# Patient Record
Sex: Female | Born: 1950 | Race: White | Hispanic: No | Smoking: Never smoker
Health system: Southern US, Community
[De-identification: ages and names within clinical notes are randomized; demographics above are authoritative.]

## PROBLEM LIST (undated history)

## (undated) DIAGNOSIS — I10 Essential (primary) hypertension: Secondary | ICD-10-CM

## (undated) DIAGNOSIS — R58 Hemorrhage, not elsewhere classified: Secondary | ICD-10-CM

## (undated) DIAGNOSIS — K219 Gastro-esophageal reflux disease without esophagitis: Secondary | ICD-10-CM

## (undated) DIAGNOSIS — K222 Esophageal obstruction: Secondary | ICD-10-CM

## (undated) DIAGNOSIS — E785 Hyperlipidemia, unspecified: Secondary | ICD-10-CM

## (undated) DIAGNOSIS — E669 Obesity, unspecified: Secondary | ICD-10-CM

## (undated) HISTORY — DX: Gastro-esophageal reflux disease without esophagitis: K21.9

## (undated) HISTORY — DX: Hyperlipidemia, unspecified: E78.5

## (undated) HISTORY — PX: KNEE SURGERY: SHX244

## (undated) HISTORY — DX: Obesity, unspecified: E66.9

## (undated) HISTORY — DX: Hemorrhage, not elsewhere classified: R58

## (undated) HISTORY — DX: Morbid (severe) obesity due to excess calories: E66.01

## (undated) HISTORY — DX: Essential (primary) hypertension: I10

## (undated) HISTORY — DX: Esophageal obstruction: K22.2

## (undated) HISTORY — PX: PARTIAL HYSTERECTOMY: SHX80

---

## 1991-02-10 DIAGNOSIS — R58 Hemorrhage, not elsewhere classified: Secondary | ICD-10-CM

## 1991-02-10 HISTORY — DX: Hemorrhage, not elsewhere classified: R58

## 2000-02-10 HISTORY — PX: NECK SURGERY: SHX720

## 2000-06-25 ENCOUNTER — Ambulatory Visit (HOSPITAL_COMMUNITY): Admission: RE | Admit: 2000-06-25 | Discharge: 2000-06-25 | Payer: Self-pay | Admitting: Pulmonary Disease

## 2000-10-12 ENCOUNTER — Ambulatory Visit (HOSPITAL_COMMUNITY): Admission: RE | Admit: 2000-10-12 | Discharge: 2000-10-12 | Payer: Self-pay | Admitting: Pulmonary Disease

## 2000-11-01 ENCOUNTER — Observation Stay (HOSPITAL_COMMUNITY): Admission: RE | Admit: 2000-11-01 | Discharge: 2000-11-02 | Payer: Self-pay | Admitting: Neurosurgery

## 2000-12-02 ENCOUNTER — Encounter: Admission: RE | Admit: 2000-12-02 | Discharge: 2000-12-02 | Payer: Self-pay | Admitting: Neurosurgery

## 2001-01-14 ENCOUNTER — Encounter: Payer: Self-pay | Admitting: Emergency Medicine

## 2001-01-14 ENCOUNTER — Emergency Department (HOSPITAL_COMMUNITY): Admission: EM | Admit: 2001-01-14 | Discharge: 2001-01-14 | Payer: Self-pay | Admitting: Emergency Medicine

## 2001-10-22 ENCOUNTER — Emergency Department (HOSPITAL_COMMUNITY): Admission: EM | Admit: 2001-10-22 | Discharge: 2001-10-23 | Payer: Self-pay | Admitting: *Deleted

## 2001-10-23 ENCOUNTER — Encounter: Payer: Self-pay | Admitting: *Deleted

## 2001-11-10 ENCOUNTER — Emergency Department (HOSPITAL_COMMUNITY): Admission: EM | Admit: 2001-11-10 | Discharge: 2001-11-10 | Payer: Self-pay | Admitting: Emergency Medicine

## 2001-11-13 ENCOUNTER — Emergency Department (HOSPITAL_COMMUNITY): Admission: EM | Admit: 2001-11-13 | Discharge: 2001-11-13 | Payer: Self-pay | Admitting: Emergency Medicine

## 2001-11-17 ENCOUNTER — Emergency Department (HOSPITAL_COMMUNITY): Admission: EM | Admit: 2001-11-17 | Discharge: 2001-11-17 | Payer: Self-pay | Admitting: Emergency Medicine

## 2001-11-24 ENCOUNTER — Emergency Department (HOSPITAL_COMMUNITY): Admission: EM | Admit: 2001-11-24 | Discharge: 2001-11-24 | Payer: Self-pay | Admitting: Emergency Medicine

## 2001-12-08 ENCOUNTER — Emergency Department (HOSPITAL_COMMUNITY): Admission: EM | Admit: 2001-12-08 | Discharge: 2001-12-08 | Payer: Self-pay | Admitting: Emergency Medicine

## 2002-01-04 ENCOUNTER — Ambulatory Visit (HOSPITAL_COMMUNITY): Admission: RE | Admit: 2002-01-04 | Discharge: 2002-01-04 | Payer: Self-pay | Admitting: Obstetrics and Gynecology

## 2002-01-04 ENCOUNTER — Encounter: Payer: Self-pay | Admitting: Obstetrics and Gynecology

## 2002-01-10 ENCOUNTER — Ambulatory Visit (HOSPITAL_COMMUNITY): Admission: RE | Admit: 2002-01-10 | Discharge: 2002-01-10 | Payer: Self-pay | Admitting: Obstetrics and Gynecology

## 2002-01-10 ENCOUNTER — Encounter: Payer: Self-pay | Admitting: Obstetrics and Gynecology

## 2002-05-07 ENCOUNTER — Emergency Department (HOSPITAL_COMMUNITY): Admission: EM | Admit: 2002-05-07 | Discharge: 2002-05-07 | Payer: Self-pay | Admitting: Internal Medicine

## 2002-05-07 ENCOUNTER — Encounter: Payer: Self-pay | Admitting: Internal Medicine

## 2002-05-10 ENCOUNTER — Ambulatory Visit (HOSPITAL_COMMUNITY): Admission: RE | Admit: 2002-05-10 | Discharge: 2002-05-10 | Payer: Self-pay | Admitting: Pulmonary Disease

## 2003-04-24 ENCOUNTER — Ambulatory Visit (HOSPITAL_COMMUNITY): Admission: RE | Admit: 2003-04-24 | Discharge: 2003-04-24 | Payer: Self-pay | Admitting: Pulmonary Disease

## 2003-05-23 ENCOUNTER — Ambulatory Visit (HOSPITAL_COMMUNITY): Admission: RE | Admit: 2003-05-23 | Discharge: 2003-05-23 | Payer: Self-pay | Admitting: Pulmonary Disease

## 2003-09-28 ENCOUNTER — Ambulatory Visit (HOSPITAL_COMMUNITY): Admission: RE | Admit: 2003-09-28 | Discharge: 2003-09-28 | Payer: Self-pay | Admitting: Pulmonary Disease

## 2003-12-21 ENCOUNTER — Emergency Department (HOSPITAL_COMMUNITY): Admission: EM | Admit: 2003-12-21 | Discharge: 2003-12-21 | Payer: Self-pay | Admitting: Emergency Medicine

## 2004-03-27 ENCOUNTER — Ambulatory Visit: Payer: Self-pay | Admitting: Internal Medicine

## 2004-03-28 ENCOUNTER — Ambulatory Visit (HOSPITAL_COMMUNITY): Admission: RE | Admit: 2004-03-28 | Discharge: 2004-03-28 | Payer: Self-pay | Admitting: Pulmonary Disease

## 2004-04-15 ENCOUNTER — Ambulatory Visit (HOSPITAL_COMMUNITY): Admission: RE | Admit: 2004-04-15 | Discharge: 2004-04-15 | Payer: Self-pay | Admitting: Internal Medicine

## 2004-04-15 ENCOUNTER — Ambulatory Visit: Payer: Self-pay | Admitting: Internal Medicine

## 2004-04-18 ENCOUNTER — Ambulatory Visit (HOSPITAL_COMMUNITY): Admission: RE | Admit: 2004-04-18 | Discharge: 2004-04-18 | Payer: Self-pay | Admitting: Cardiology

## 2004-05-01 ENCOUNTER — Inpatient Hospital Stay (HOSPITAL_BASED_OUTPATIENT_CLINIC_OR_DEPARTMENT_OTHER): Admission: RE | Admit: 2004-05-01 | Discharge: 2004-05-01 | Payer: Self-pay | Admitting: Cardiology

## 2004-07-15 ENCOUNTER — Ambulatory Visit (HOSPITAL_COMMUNITY): Admission: RE | Admit: 2004-07-15 | Discharge: 2004-07-15 | Payer: Self-pay | Admitting: Internal Medicine

## 2004-07-18 ENCOUNTER — Ambulatory Visit (HOSPITAL_COMMUNITY): Admission: RE | Admit: 2004-07-18 | Discharge: 2004-07-18 | Payer: Self-pay | Admitting: Internal Medicine

## 2004-07-23 ENCOUNTER — Ambulatory Visit (HOSPITAL_COMMUNITY): Admission: RE | Admit: 2004-07-23 | Discharge: 2004-07-23 | Payer: Self-pay | Admitting: Pulmonary Disease

## 2004-08-11 ENCOUNTER — Ambulatory Visit: Payer: Self-pay | Admitting: Internal Medicine

## 2004-09-26 ENCOUNTER — Ambulatory Visit (HOSPITAL_COMMUNITY): Admission: RE | Admit: 2004-09-26 | Discharge: 2004-09-26 | Payer: Self-pay | Admitting: Cardiology

## 2006-05-14 ENCOUNTER — Ambulatory Visit (HOSPITAL_COMMUNITY): Admission: RE | Admit: 2006-05-14 | Discharge: 2006-05-14 | Payer: Self-pay | Admitting: Pulmonary Disease

## 2006-05-20 ENCOUNTER — Ambulatory Visit (HOSPITAL_COMMUNITY): Admission: RE | Admit: 2006-05-20 | Discharge: 2006-05-20 | Payer: Self-pay | Admitting: Obstetrics and Gynecology

## 2006-08-06 ENCOUNTER — Ambulatory Visit (HOSPITAL_COMMUNITY): Admission: RE | Admit: 2006-08-06 | Discharge: 2006-08-06 | Payer: Self-pay | Admitting: Cardiology

## 2007-06-29 ENCOUNTER — Ambulatory Visit: Payer: Self-pay | Admitting: Internal Medicine

## 2007-07-11 ENCOUNTER — Ambulatory Visit: Payer: Self-pay | Admitting: Internal Medicine

## 2007-07-11 ENCOUNTER — Ambulatory Visit (HOSPITAL_COMMUNITY): Admission: RE | Admit: 2007-07-11 | Discharge: 2007-07-11 | Payer: Self-pay | Admitting: Internal Medicine

## 2007-07-11 HISTORY — PX: ESOPHAGOGASTRODUODENOSCOPY: SHX1529

## 2007-07-11 HISTORY — PX: COLONOSCOPY: SHX174

## 2007-08-26 ENCOUNTER — Ambulatory Visit: Payer: Self-pay | Admitting: Internal Medicine

## 2007-11-08 ENCOUNTER — Ambulatory Visit: Payer: Self-pay | Admitting: Family Medicine

## 2007-11-08 DIAGNOSIS — IMO0002 Reserved for concepts with insufficient information to code with codable children: Secondary | ICD-10-CM

## 2007-11-08 DIAGNOSIS — K649 Unspecified hemorrhoids: Secondary | ICD-10-CM | POA: Insufficient documentation

## 2007-11-08 DIAGNOSIS — E785 Hyperlipidemia, unspecified: Secondary | ICD-10-CM

## 2007-11-08 DIAGNOSIS — K208 Other esophagitis: Secondary | ICD-10-CM

## 2007-11-08 DIAGNOSIS — I10 Essential (primary) hypertension: Secondary | ICD-10-CM | POA: Insufficient documentation

## 2007-11-08 DIAGNOSIS — Z8679 Personal history of other diseases of the circulatory system: Secondary | ICD-10-CM | POA: Insufficient documentation

## 2007-11-11 ENCOUNTER — Ambulatory Visit (HOSPITAL_COMMUNITY): Admission: RE | Admit: 2007-11-11 | Discharge: 2007-11-11 | Payer: Self-pay | Admitting: Family Medicine

## 2007-11-11 ENCOUNTER — Telehealth (INDEPENDENT_AMBULATORY_CARE_PROVIDER_SITE_OTHER): Payer: Self-pay | Admitting: Family Medicine

## 2007-11-11 ENCOUNTER — Encounter (INDEPENDENT_AMBULATORY_CARE_PROVIDER_SITE_OTHER): Payer: Self-pay | Admitting: Family Medicine

## 2007-11-23 ENCOUNTER — Ambulatory Visit: Payer: Self-pay | Admitting: Family Medicine

## 2007-11-23 LAB — CONVERTED CEMR LAB
Bilirubin Urine: NEGATIVE
Blood in Urine, dipstick: NEGATIVE
Cholesterol, target level: 200 mg/dL
Glucose, Urine, Semiquant: NEGATIVE
HDL goal, serum: 40 mg/dL
Ketones, urine, test strip: NEGATIVE
LDL Goal: 130 mg/dL
Nitrite: NEGATIVE
Protein, U semiquant: NEGATIVE
Specific Gravity, Urine: 1.015
Urobilinogen, UA: 0.2
WBC Urine, dipstick: NEGATIVE
pH: 6

## 2007-12-08 ENCOUNTER — Encounter (INDEPENDENT_AMBULATORY_CARE_PROVIDER_SITE_OTHER): Payer: Self-pay | Admitting: Family Medicine

## 2007-12-27 ENCOUNTER — Encounter (INDEPENDENT_AMBULATORY_CARE_PROVIDER_SITE_OTHER): Payer: Self-pay | Admitting: Family Medicine

## 2007-12-28 LAB — CONVERTED CEMR LAB
ALT: 18 units/L (ref 0–35)
AST: 25 units/L (ref 0–37)
Albumin: 4.5 g/dL (ref 3.5–5.2)
Alkaline Phosphatase: 50 units/L (ref 39–117)
BUN: 40 mg/dL — ABNORMAL HIGH (ref 6–23)
Basophils Absolute: 0.1 10*3/uL (ref 0.0–0.1)
Basophils Relative: 1 % (ref 0–1)
CO2: 26 meq/L (ref 19–32)
Calcium: 9.8 mg/dL (ref 8.4–10.5)
Chloride: 102 meq/L (ref 96–112)
Cholesterol: 165 mg/dL (ref 0–200)
Creatinine, Ser: 1.01 mg/dL (ref 0.40–1.20)
Eosinophils Absolute: 0.1 10*3/uL (ref 0.0–0.7)
Eosinophils Relative: 2 % (ref 0–5)
Glucose, Bld: 87 mg/dL (ref 70–99)
HCT: 37.1 % (ref 36.0–46.0)
HDL: 68 mg/dL (ref >39)
Hemoglobin: 11.9 g/dL — ABNORMAL LOW (ref 12.0–15.0)
LDL Cholesterol: 79 mg/dL (ref 0–99)
Lymphocytes Relative: 29 % (ref 12–46)
Lymphs Abs: 2.3 10*3/uL (ref 0.7–4.0)
MCHC: 32.1 g/dL (ref 30.0–36.0)
MCV: 91.4 fL (ref 78.0–100.0)
Monocytes Absolute: 0.6 10*3/uL (ref 0.1–1.0)
Monocytes Relative: 7 % (ref 3–12)
Neutro Abs: 4.9 10*3/uL (ref 1.7–7.7)
Neutrophils Relative %: 61 % (ref 43–77)
Platelets: 218 10*3/uL (ref 150–400)
Potassium: 4.3 meq/L (ref 3.5–5.3)
RBC: 4.06 M/uL (ref 3.87–5.11)
RDW: 13.8 % (ref 11.5–15.5)
Sodium: 140 meq/L (ref 135–145)
TSH: 2.815 microintl units/mL (ref 0.350–4.50)
Total Bilirubin: 1.7 mg/dL — ABNORMAL HIGH (ref 0.3–1.2)
Total CHOL/HDL Ratio: 2.4
Total Protein: 6.9 g/dL (ref 6.0–8.3)
Triglycerides: 90 mg/dL (ref <150)
VLDL: 18 mg/dL (ref 0–40)
WBC: 8 10*3/uL (ref 4.0–10.5)

## 2008-01-04 ENCOUNTER — Ambulatory Visit: Payer: Self-pay | Admitting: Family Medicine

## 2008-01-04 DIAGNOSIS — E669 Obesity, unspecified: Secondary | ICD-10-CM | POA: Insufficient documentation

## 2008-01-04 DIAGNOSIS — R17 Unspecified jaundice: Secondary | ICD-10-CM | POA: Insufficient documentation

## 2008-01-04 DIAGNOSIS — J301 Allergic rhinitis due to pollen: Secondary | ICD-10-CM

## 2008-01-04 DIAGNOSIS — D649 Anemia, unspecified: Secondary | ICD-10-CM | POA: Insufficient documentation

## 2008-01-05 ENCOUNTER — Encounter (INDEPENDENT_AMBULATORY_CARE_PROVIDER_SITE_OTHER): Payer: Self-pay | Admitting: Family Medicine

## 2008-01-09 LAB — CONVERTED CEMR LAB
Iron: 92 ug/dL (ref 42–145)
Retic Ct Pct: 1.2 % (ref 0.4–3.1)
Saturation Ratios: 27 % (ref 20–55)
TIBC: 347 ug/dL (ref 250–470)
UIBC: 255 ug/dL

## 2008-03-13 ENCOUNTER — Telehealth (INDEPENDENT_AMBULATORY_CARE_PROVIDER_SITE_OTHER): Payer: Self-pay | Admitting: *Deleted

## 2008-03-14 ENCOUNTER — Ambulatory Visit: Payer: Self-pay | Admitting: Internal Medicine

## 2008-03-14 ENCOUNTER — Encounter (INDEPENDENT_AMBULATORY_CARE_PROVIDER_SITE_OTHER): Payer: Self-pay | Admitting: Family Medicine

## 2008-04-04 ENCOUNTER — Ambulatory Visit: Payer: Self-pay | Admitting: Family Medicine

## 2008-05-02 ENCOUNTER — Ambulatory Visit: Payer: Self-pay | Admitting: Family Medicine

## 2008-05-02 DIAGNOSIS — E559 Vitamin D deficiency, unspecified: Secondary | ICD-10-CM | POA: Insufficient documentation

## 2008-05-03 ENCOUNTER — Encounter (INDEPENDENT_AMBULATORY_CARE_PROVIDER_SITE_OTHER): Payer: Self-pay | Admitting: Family Medicine

## 2008-05-03 LAB — CONVERTED CEMR LAB
ALT: 17 units/L (ref 0–35)
AST: 24 units/L (ref 0–37)
Albumin: 4.5 g/dL (ref 3.5–5.2)
Alkaline Phosphatase: 42 units/L (ref 39–117)
BUN: 29 mg/dL — ABNORMAL HIGH (ref 6–23)
Basophils Absolute: 0 10*3/uL (ref 0.0–0.1)
Basophils Relative: 1 % (ref 0–1)
CO2: 23 meq/L (ref 19–32)
Calcium: 10.1 mg/dL (ref 8.4–10.5)
Chloride: 103 meq/L (ref 96–112)
Creatinine, Ser: 1.08 mg/dL (ref 0.40–1.20)
Eosinophils Absolute: 0.1 10*3/uL (ref 0.0–0.7)
Eosinophils Relative: 2 % (ref 0–5)
Glucose, Bld: 91 mg/dL (ref 70–99)
HCT: 39 % (ref 36.0–46.0)
Hemoglobin: 13.2 g/dL (ref 12.0–15.0)
Lymphocytes Relative: 31 % (ref 12–46)
Lymphs Abs: 1.9 10*3/uL (ref 0.7–4.0)
MCHC: 33.8 g/dL (ref 30.0–36.0)
MCV: 88.6 fL (ref 78.0–100.0)
Monocytes Absolute: 0.5 10*3/uL (ref 0.1–1.0)
Monocytes Relative: 8 % (ref 3–12)
Neutro Abs: 3.7 10*3/uL (ref 1.7–7.7)
Neutrophils Relative %: 59 % (ref 43–77)
Platelets: 213 10*3/uL (ref 150–400)
Potassium: 4.1 meq/L (ref 3.5–5.3)
RBC: 4.4 M/uL (ref 3.87–5.11)
RDW: 13.3 % (ref 11.5–15.5)
Sodium: 139 meq/L (ref 135–145)
Total Bilirubin: 1.9 mg/dL — ABNORMAL HIGH (ref 0.3–1.2)
Total Protein: 7.1 g/dL (ref 6.0–8.3)
WBC: 6.2 10*3/uL (ref 4.0–10.5)

## 2008-05-08 LAB — CONVERTED CEMR LAB: Vit D, 25-Hydroxy: 77 ng/mL (ref 30–89)

## 2008-08-01 ENCOUNTER — Ambulatory Visit: Payer: Self-pay | Admitting: Family Medicine

## 2008-08-01 DIAGNOSIS — R0609 Other forms of dyspnea: Secondary | ICD-10-CM

## 2008-08-01 DIAGNOSIS — R0989 Other specified symptoms and signs involving the circulatory and respiratory systems: Secondary | ICD-10-CM

## 2008-08-02 ENCOUNTER — Encounter (INDEPENDENT_AMBULATORY_CARE_PROVIDER_SITE_OTHER): Payer: Self-pay | Admitting: Family Medicine

## 2008-08-02 LAB — CONVERTED CEMR LAB
ALT: 13 units/L (ref 0–35)
AST: 24 units/L (ref 0–37)
Albumin: 4.3 g/dL (ref 3.5–5.2)
Alkaline Phosphatase: 40 units/L (ref 39–117)
Bilirubin, Direct: 0.4 mg/dL — ABNORMAL HIGH (ref 0.0–0.3)
Indirect Bilirubin: 1.4 mg/dL — ABNORMAL HIGH (ref 0.0–0.9)
Total Bilirubin: 1.8 mg/dL — ABNORMAL HIGH (ref 0.3–1.2)
Total Protein: 6.9 g/dL (ref 6.0–8.3)

## 2008-08-16 DIAGNOSIS — I728 Aneurysm of other specified arteries: Secondary | ICD-10-CM | POA: Insufficient documentation

## 2008-08-16 DIAGNOSIS — R002 Palpitations: Secondary | ICD-10-CM

## 2008-08-17 ENCOUNTER — Encounter (INDEPENDENT_AMBULATORY_CARE_PROVIDER_SITE_OTHER): Payer: Self-pay | Admitting: Family Medicine

## 2008-08-17 ENCOUNTER — Ambulatory Visit: Payer: Self-pay | Admitting: Cardiology

## 2008-08-17 LAB — CONVERTED CEMR LAB
BUN: 26 mg/dL — ABNORMAL HIGH (ref 6–23)
Basophils Absolute: 0 10*3/uL (ref 0.0–0.1)
Basophils Relative: 1 % (ref 0–1)
CO2: 31 meq/L (ref 19–32)
Calcium: 9.7 mg/dL (ref 8.4–10.5)
Chloride: 105 meq/L (ref 96–112)
Creatinine, Ser: 1.1 mg/dL (ref 0.40–1.20)
Eosinophils Absolute: 0.1 10*3/uL (ref 0.0–0.7)
Eosinophils Relative: 2 % (ref 0–5)
Glucose, Bld: 101 mg/dL — ABNORMAL HIGH (ref 70–99)
HCT: 35.3 % — ABNORMAL LOW (ref 36.0–46.0)
Hemoglobin: 12.5 g/dL (ref 12.0–15.0)
INR: 0.9 (ref 0.0–1.5)
Lymphocytes Relative: 28 % (ref 12–46)
Lymphs Abs: 2.1 10*3/uL (ref 0.7–4.0)
MCHC: 35.4 g/dL (ref 30.0–36.0)
MCV: 89.4 fL (ref 78.0–100.0)
Monocytes Absolute: 0.6 10*3/uL (ref 0.1–1.0)
Monocytes Relative: 8 % (ref 3–12)
Neutro Abs: 4.5 10*3/uL (ref 1.7–7.7)
Neutrophils Relative %: 62 % (ref 43–77)
Platelets: 182 10*3/uL (ref 150–400)
Potassium: 4.4 meq/L (ref 3.5–5.3)
Prothrombin Time: 12.4 s (ref 11.6–15.2)
RBC: 3.95 M/uL (ref 3.87–5.11)
RDW: 13.1 % (ref 11.5–15.5)
Sodium: 141 meq/L (ref 135–145)
WBC: 7.3 10*3/uL (ref 4.0–10.5)
aPTT: 33 s (ref 24–37)

## 2008-08-21 ENCOUNTER — Ambulatory Visit (HOSPITAL_COMMUNITY): Admission: RE | Admit: 2008-08-21 | Discharge: 2008-08-21 | Payer: Self-pay | Admitting: Cardiovascular Disease

## 2008-08-21 ENCOUNTER — Ambulatory Visit: Payer: Self-pay | Admitting: Cardiovascular Disease

## 2008-08-30 ENCOUNTER — Ambulatory Visit: Payer: Self-pay | Admitting: Family Medicine

## 2008-09-06 ENCOUNTER — Ambulatory Visit (HOSPITAL_COMMUNITY): Admission: RE | Admit: 2008-09-06 | Discharge: 2008-09-06 | Payer: Self-pay | Admitting: Family Medicine

## 2008-09-06 ENCOUNTER — Encounter (INDEPENDENT_AMBULATORY_CARE_PROVIDER_SITE_OTHER): Payer: Self-pay | Admitting: Family Medicine

## 2008-09-13 ENCOUNTER — Encounter: Payer: Self-pay | Admitting: Gastroenterology

## 2008-09-13 ENCOUNTER — Ambulatory Visit: Payer: Self-pay | Admitting: Cardiology

## 2008-10-11 ENCOUNTER — Ambulatory Visit: Payer: Self-pay | Admitting: Family Medicine

## 2008-10-11 DIAGNOSIS — J069 Acute upper respiratory infection, unspecified: Secondary | ICD-10-CM | POA: Insufficient documentation

## 2008-10-11 LAB — CONVERTED CEMR LAB
HDL goal, serum: 40 mg/dL
LDL Goal: 100 mg/dL

## 2008-12-19 ENCOUNTER — Ambulatory Visit (HOSPITAL_COMMUNITY): Admission: RE | Admit: 2008-12-19 | Discharge: 2008-12-19 | Payer: Self-pay | Admitting: Family Medicine

## 2009-03-01 ENCOUNTER — Encounter (INDEPENDENT_AMBULATORY_CARE_PROVIDER_SITE_OTHER): Payer: Self-pay | Admitting: *Deleted

## 2009-03-20 DIAGNOSIS — K319 Disease of stomach and duodenum, unspecified: Secondary | ICD-10-CM

## 2009-03-20 DIAGNOSIS — Z8719 Personal history of other diseases of the digestive system: Secondary | ICD-10-CM

## 2009-04-04 ENCOUNTER — Ambulatory Visit: Payer: Self-pay | Admitting: Internal Medicine

## 2009-04-04 DIAGNOSIS — K210 Gastro-esophageal reflux disease with esophagitis: Secondary | ICD-10-CM

## 2009-10-15 ENCOUNTER — Encounter: Payer: Self-pay | Admitting: Gastroenterology

## 2010-03-13 NOTE — Medication Information (Signed)
Summary: OMEPRAZOLE DR 20MG   OMEPRAZOLE DR 20MG    Imported By: Rexene Alberts 10/15/2009 08:48:57  _____________________________________________________________________  External Attachment:    Type:   Image     Comment:   External Document  Appended Document: OMEPRAZOLE DR 20MG  duplicate

## 2010-03-13 NOTE — Letter (Signed)
Summary: Appointment Reminder  [facility]  [address]   [place], [place] [address]   Phone: [phone]  Fax: [phone]       March 01, 2009   Beverly Young [address] [place], [place]  [address] 15-Apr-1950    Dear Ms. Kearl,  We have been unable to reach you by phone to schedule a follow up   appointment that was recommended for you by Dr. Jena Gauss. It is very   important that we reach you to schedule an appointment. We hope that you  allow [place] to participate in your health care needs. Please contact us at  [phone] at your earliest convenience to schedule your appointment.  Sincerely,    [facility] R. Roetta Sessions, M.D.    Kassie Mends, M.D. Lorenza Burton, FNP-BC    Tana Coast, PA-C Phone: [phone]    Fax: [phone]

## 2010-03-13 NOTE — Assessment & Plan Note (Signed)
Summary: yr fu,gerd/ss   Visit Type:  Follow-up Visit Primary Care Provider:  DonDiego  Chief Complaint:  F/U Genella Rife.  History of Present Illness: Beverly Young is ais a pleasant 60 year old lady who presents for one-year followup of acid reflux disease. She was last seen in 2/10. She has been maintained on omeprazole 20 mg daily.  She's been doing very well.  Denies typical heartburn symptoms. Denies any difficulty swallowing, nausea or vomiting, abdominal pain. Her bowel movements are regular for the most part but occasionally has constipation. She recently started Metamucil which seems to help. Denies any melena or bright red blood per rectum. She continues to watch her diet and exercise regularly. She has lost another 9 pounds since we saw her in one year ago. She states her wt is up 6 pounds the last couple of weeks.  Her last colonoscopy and EGD was in June 2009. She had circumferential distal esophageal erosions. Soft peptic stricture/ring status post dilation. She had external hemorrhoid tag, anal canal hemorrhoids which were friable, pancolonic diverticula. She had a suboptimal prep on the right side of the colon but no obvious polyp seen. Patient's family history positive for colon cancer in a first-degree relative at advanced age (48s). Based on this, she is classified as avg risk, next TCS 2019.  Current Medications (verified): 1)  Crestor 10 Mg Tabs (Rosuvastatin Calcium) .... Once Daily 2)  Celebrex 200 Mg Caps (Celecoxib) .Marland Kitchen.. 1 Tab Every Other Day 3)  Metoprolol Succinate 50 Mg Xr24h-Tab (Metoprolol Succinate) .... One Daily 4)  Omeprazole 20 Mg Cpdr (Omeprazole) .Marland Kitchen.. 1 Tab Once Daily 5)  Ramipril 10 Mg Caps (Ramipril) .... Once Daily 6)  Caltrate 600+d Plus 600-400 Mg-Unit Tabs (Calcium Carbonate-Vit D-Min) .... Two Times A Day 7)  Centrum Silver  Tabs (Multiple Vitamins-Minerals) .... One Daily 8)  Diovan Hct 320-25 Mg Tabs (Valsartan-Hydrochlorothiazide) .... Once Daily 9)   Glucosamine Sulfate 1000 Mg Caps (Glucosamine Sulfate) .... Take 1 G Daily Per Ortho  Allergies (verified): No Known Drug Allergies  Review of Systems      See HPI  Vital Signs:  Patient profile:   60 year old female Height:      64.5 inches Weight:      256 pounds BMI:     43.42 Temp:     98.0 degrees F rectal Pulse rate:   60 / minute BP sitting:   158 / 98  (left arm) Cuff size:   large  Vitals Entered By: Cloria Spring LPN (April 04, 2009 10:40 AM)  Physical Exam  General:  Well developed, well nourished, no acute distress.obese.   Head:  Normocephalic and atraumatic. Eyes:  Sclera nonicteric. Mouth:  OP moist. Abdomen:  Bowel sounds normal.  Abdomen is soft, nontender, nondistended.  No rebound or guarding.  No hepatosplenomegaly, masses or hernias.  No abdominal bruits.  Extremities:  Bilateral trace pedal edema.   Neurologic:  Alert and  oriented x4;  grossly normal neurologically. Skin:  Intact without significant lesions or rashes. Psych:  Alert and cooperative. Normal mood and affect.  Impression & Recommendations:  Problem # 1:  REFLUX ESOPHAGITIS (ICD-530.11)  Doing well on omeprazole. She will continue as before. OV in one year or sooner if needed. Encouraged continued dietary modifications and excercise with goal of slow gradual wt loss.   Orders: Est. Patient Level II (95621)  Problem # 2:  Family Hx of COLON CANCER (ICD-153.9)  FH CRC, first degree relative at advanced age (75s). No personal  h/o adenomatous polyps. Per Dr. Luvenia Starch previous recommendations, next TCS due 2019. Recommend yearly hemoccults through PCP.   Orders: Est. Patient Level II (13244)

## 2010-05-18 LAB — BLOOD GAS, ARTERIAL
FIO2: 21 %
O2 Content: 21 L/min
TCO2: 23.7 mmol/L (ref 0–100)
pH, Arterial: 7.413 — ABNORMAL HIGH (ref 7.350–7.400)

## 2010-06-24 NOTE — Assessment & Plan Note (Signed)
[facility]                       [place] [facility] OFFICE NOTE   RAMINA, HULET                        MRN:          [id number]  DATE:09/13/2008                            DOB:          01-19-1951    Ms. Micheli comes back today for followup of her dyspnea on exertion.   Cardiac catheterization showed no coronary artery plaque  angiographically with normal left ventricular function.   She recently had pulmonary function studies, which are pending.  She has  not been a smoker in the past.  I suspect that they will be normal.  There is no history of asthma.  She is considerably overweight and this  may just be deconditioning.   I have explained to her at length the findings of her cardiac  catheterization and have also explained her that she does not have  congestive heart failure.  She is afraid of this because her mother had  congestive heart failure.  I told her if she would keep her blood  pressure in check, exercise on a regular basis, lose weight that her  chance developing diastolic heart failure down the road would be  considerably less.   We will see her back on a p.r.n. basis.   Note that her blood pressure today is 119/74 with the heart rate of 60.     Thomas C. Daleen Squibb, MD, [facility]  Electronically Signed    TCW/MedQ  DD: 09/13/2008  DT: 09/14/2008  Job #: [id number]   cc:   Franchot Heidelberg, M.D.

## 2010-06-24 NOTE — Assessment & Plan Note (Signed)
NAMEMarland Kitchen  Beverly Young, Beverly Young                  CHART#:  [id number]   DATE:  08/26/2007                       DOB:  [birth year]   PRIMARY CARE PHYSICIAN:  Ramon Dredge L. Juanetta Gosling, MD   CHIEF COMPLAINT:  Followup erosive esophagitis and hemorrhoids.   PROBLEM LIST:  1. Chronic GERD with erosive reflux esophagitis on last EGD,      07/11/2007.  2. Esophageal dysphagia with soft peptic stricture ring, which was      dilated with a 54-French Maloney dilator on last EGD, 07/11/2007,      by Dr. Jena Gauss.  3. Hemorrhoids seen on colonoscopy, 07/11/2007, by Dr. Jena Gauss.  4. Diverticulosis.  5. Hypertension.  6. Irregular heart rate.  7. Cerebral hemorrhage in 1993.  8. Cervical disk surgery.  9. Status post partial hysterectomy.  10.Status post knee surgery.   SUBJECTIVE:  The patient is a 60 year old Caucasian female who reports  for follow up of above colonoscopy and EGD.  She is doing very well.  Her appetite is good.  Her weight remains stable.  She had no further  rectal bleeding.  Denies any problems with her bowels at this time.  Denies any heartburn or ingestion.  She is on Prilosec 20 mg b.i.d.  This does seem to squelch her GERD symptoms.  Her dysphagia has resolved  post dilatation.  She has had a recent bone densitometry through Dr.  Adah Perl office within the last couple of years.  She is on supplemental  calcium.  She is obese and is considering evaluation by dietitian for  further teaching on low-fat, low-cholesterol diet.  She is doing some  exercising at home and has lost a couple of pounds doing this and is  interested in losing more.   MEDICATIONS:  See the list of 08/26/2007.   ALLERGIES:  No known drug allergies.   OBJECTIVE:  VITAL SIGNS:  Weight 269 pounds, height 64-1/2 inches,  temperature 97.9, blood pressure 120/82, and pulse 72.  GENERAL:  The patient is an obese Caucasian female who is alert,  oriented, pleasant, and cooperative, in no acute distress.  HEENT:  Sclerae  clear, nonicteric.  Conjunctivae pink.  Oropharynx pink  and moist without lesions.  NECK:  Supple without masses or thyromegaly.  CHEST:  Heart has regular rhythm.  Normal S1 and S2.  No murmurs,  clicks, rubs, or gallops.  ABDOMEN:  Protuberant with positive bowel sounds x4.  No bruits  auscultated.  Soft, nontender, nondistended without palpable mass or  hepatosplenomegaly.  No rebound, tenderness or guarding.  Exam is  limited given the patient's body habitus.  EXTREMITIES:  Without clubbing or edema.   ASSESSMENT:  Erosive reflux esophagitis/chronic gastroesophageal reflux  disease with history of peptic stricture status post dilatation.  The  patient is doing well.   Family history of colon carcinoma.   Diverticulosis.   Obesity.   PLAN:  1. Colonoscopy, June 2014.  2. Diverticulosis literature and GERD literature given for review.  3. Benefiber samples.  4. Recommended high-fiber diet.  5. Dietician consult for low-fat, low-cholesterol diet for weight      loss.  6. Office visit in 6 months with Dr. Jena Gauss or sooner if needed.       Lorenza Burton, N.P.  Electronically Signed     R. Roetta Sessions, M.D.  Electronically Signed    KJ/MEDQ  D:  08/26/2007  T:  08/27/2007  Job:  [id number]   cc:   Ramon Dredge L. Juanetta Gosling, M.D.

## 2010-06-24 NOTE — Cardiovascular Report (Signed)
NAMEMIN, TUNNELL NO.:  [id number]   MEDICAL RECORD NO.:  [id number]          PATIENT TYPE:  OIB   LOCATION:  2899                         FACILITY:  [facility]   PHYSICIAN:  Veverly Fells. Excell Seltzer, MD  DATE OF BIRTH:  1950-03-23   DATE OF PROCEDURE:  DATE OF DISCHARGE:  08/21/2008                            CARDIAC CATHETERIZATION   REASON FOR PROCEDURE:  Exertional dyspnea.  The patient with multiple  cardiac risk factors.  Rule out obstructive CAD.   Risks and indications of the procedure were reviewed with the patient.  Informed consent was obtained.  The right groin was prepped, draped,  anesthetized with 1% lidocaine.  Using the modified Seldinger technique,  a 5-French sheath was placed in the right femoral artery.  Standard 5-  French Judkins catheters were used for coronary angiography and left  ventriculography.   The patient tolerated the procedure well.  There were no immediate  complications.  All catheter exchanges were performed over a guidewire.   FINDINGS:  Aortic pressure 135/72 with a mean of 98, left ventricular  pressure 136/21.   Coronary angiography:  Left mainstem:  The left mainstem is angiographically normal and  bifurcates into the LAD and left circumflex.  There was no significant  obstructive disease.   LAD:  The LAD is a large-caliber vessel that courses down and reaches  the LV apex.  It supplies 3 moderate-sized diagonal branches.  There was  no significant stenosis throughout the LAD or its diagonal branches.   Left circumflex:  The left circumflex is a moderate to large size  vessel.  It supplies a small first OM branch and a large second OM.  There is no significant stenosis throughout the course of the left  circumflex.   Right coronary artery:  The right coronary artery is a large vessel.  It  is dominant.  It supplies a PDA branch, as well as a posterolateral  branch.  There is no significant stenosis throughout the  distribution of  the right coronary artery.   Left ventriculography:  Left ventricular function is normal.  The LVEF  is estimated at 55-60%.  There are no regional wall motion  abnormalities.   ASSESSMENT:  1. Normal coronary arteries.  2. Normal left ventricular function.   PLAN:  The patient does not appear to have significant cardiac disease.  Her left ventricular end-diastolic pressure is mildly increased.  This  could contribute to her exertional dyspnea, but I suspect it is  noncardiac in nature.  Recommend followup with the patient's primary  care physician.      Veverly Fells. Excell Seltzer, MD  Electronically Signed     Veverly Fells. Excell Seltzer, MD  Electronically Signed    MDC/MEDQ  D:  08/21/2008  T:  08/21/2008  Job:  [id number]   cc:   Franchot Heidelberg, M.D.  Thomas C. Wall, MD, [facility]

## 2010-06-24 NOTE — Assessment & Plan Note (Signed)
Beverly Young, Beverly Young                  CHART#:  [id number]   DATE:  03/14/2008                       DOB:  05-Oct-1950   FOLLOWUP:  Gastroesophageal reflux disease, erosive reflux esophagitis,  peptic stricture requiring dilation back in 2009, history of hemorrhoids  on colonoscopy on 07/31/2007, distant history of inflammatory polyp,  positive family history of colon cancer, but in first-degree relative at  advanced age (71 decade).   The patient returns today.  She has joined [employer], has lost 8 pounds.  She  is still morbidly obese, but is determined to lose weight.  Her reflux  symptoms continued to be well controlled on omeprazole 20 mg orally  daily.  She is having no dysphagia.  No bowel symptoms.   CURRENT MEDICATIONS:  See updated list.   ALLERGIES:  No known drug allergies.   PHYSICAL EXAMINATION:  GENERAL:  Today, she appears well.  VITAL SIGNS:  Weight 267.5, height 5 feet 5-1/2 inches, temperature  98.1, BP 122/82, and pulse 72.  SKIN:  Warm and dry.  CHEST:  Lungs clear to auscultation.  CARDIAC:  Regular rate and rhythm without murmur, gallop, or rub.  ABDOMEN:  Obese, positive bowel sounds, soft, nontender without  appreciable mass or organomegaly.   ASSESSMENT:  History of gastroesophageal reflux disease/erosive reflux  esophagitis, peptic ulcer stricture status post dilation, really doing  very well on omeprazole 20 mg orally daily.  The potential for recurrent  dysphagia and need for subsequent dilations has been reviewed and the  need to stay on indefinite long-term acid suppression, reviewed and  initiates.  She is recommended for weight loss and I told her I would  like her to see at least another 20 pounds within the next 12 months.  Unless something comes up, I will plan to see this nice lady back in 1  year.  Positive family history of colon cancer, but not at an early age.  Therefore, she is  deemed to be at average risk and I would consequently recommend  she  return for screening colonoscopy in 2019.       Jonathon Bellows, M.D.  Electronically Signed     RMR/MEDQ  D:  03/14/2008  T:  03/15/2008  Job:  [id number]   cc:   Franchot Heidelberg, M.D.

## 2010-06-24 NOTE — H&P (Signed)
NAMEGLENIS, MUSOLF                 ACCOUNT NO.:  [id number]   MEDICAL RECORD NO.:  [id number]          PATIENT TYPE:  AMB   LOCATION:  DAY                           FACILITY:  [facility]   PHYSICIAN:  R. Roetta Sessions, M.D. DATE OF BIRTH:  07/04/1950   DATE OF ADMISSION:  DATE OF DISCHARGE:  LH                              HISTORY & PHYSICAL   REASON FOR CONSULTATION:  Rectal discomfort/throbbing.   HISTORY OF PRESENT ILLNESS:  The patient is a very pleasant 60 year old  obese lady, seen at the request of Dr. Juanetta Gosling to further evaluate a 6-  month history of intermittent anorectal burning, aching, and throbbing.  She has intermittent bouts of gross blood per rectum, sometimes has  difficulty getting herself clean after she has a bowel movement.  Sometimes, she wipes too hard and feels intense throbbing for some time  after she has a bowel movement/wipes and sits for any period of time.  She has not really had any severe burning or lancinating jabbing type  pain and is more of pressure discomfort.  She has 1-2 bowel movements  daily.  She really denies constipation or diarrhea.  She has a positive  family history of colon cancer in her father.  She underwent a  colonoscopy by Dr. Dionicia Abler back in March 2006, which revealed sigmoid  diverticula and external hemorrhoids while inflammatory polyp was  removed from her rectum.  She is due for surveillance in 2011.  She  really has not had any associated abdominal pain.  She has gained 12  pounds since she was weighed here in February 2006.   She also gives a history of crescendo reflux symptoms.  She just started  to observe retrosternal burning and regurgitation of a sour material  from time-to-time.  She has these symptoms twice daily on average and  takes Tums everyday.  She has not been on any acid suppressant agents.  Over the past 6 months, she has noted progressive difficulty in getting  solid food and pills to go down and some time she  has trouble with  liquids too.  She has to be very careful with meal and put very small  pieces of things like beef and bread into her mouth for fear they would  get stuck.  She does not use tobacco or alcohol.  She never had her  upper GI tract evaluated.   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   CURRENT MEDICATIONS:  1. Celebrex 200 mg every other day.  2. Lasix 40 mg 1 tablet once weekly.  3. Toprol XL 50 mg b.i.d.  4. Altace 20 mg daily.  5. Diovan and hydrochlorothiazide  320/25 mg daily.  6. Crestor 10 mg daily.  7. Calcium 500/vitamin D daily.  8. Glucosamine 1 g daily.  9. Multivitamin daily.   ALLERGIES:  No known drug allergies.   FAMILY HISTORY:  Significant in her father is alive, but he was  diagnosed with colon cancer at the age 50.  She has a sister who has  multiple colonic polyps.  She has a paternal aunt with liver cancer.   SOCIAL HISTORY:  She is married, has a son.  She is self-employed as a  Associate Professor.  No history of tobacco or alcohol ever.  No illicit  drugs.   REVIEW OF SYSTEMS:  No recent chest pain or dyspnea on exertion.  No  fever or chills.  Weight gain as outlined above.   PHYSICAL EXAMINATION:  GENERAL:  A pleasant 60 year old lady resting  comfortably.  VITAL SIGNS:  Weight 273, height 5 feet and 1-1/2 inches, temperature  98, BP 110/70, and pulse 72.  SKIN:  Warm and dry.  There is no jaundice.  HEENT:  No scleral icterus.  Conjunctivae pink.  CHEST:  Lungs are clear to auscultation.  CARDIAC:  Regular rate and rhythm without murmur, gallop, or rub.  BREASTS:  Deferred.  ABDOMEN:  Massively obese, positive bowel sounds, soft.  No obvious mass  or organomegaly.  The abdomen is nontender to palpation.  EXTREMITIES:  No edema.  RECTAL:   Difficult due to given a large body habitus.  No obvious  external lesions.  The anorectum is nontender.  Digital exam in all  quadrants, scant brown stool in the rectal vault, hemoccult negative.  I  do not appreciate any mass.   IMPRESSION:  The patient is a pleasant 60 year old lady with vague  anorectal discomfort and throbbing sensation at times and not particular  after vigorous wiping.  She has some difficulty with good evacuations  and has some seepage/leakage following a bowel movement.  She reports  intermittent hematochezia and reportedly has external hemorrhoids,  although they could not be appreciated on today's digital rectal exam.  I suspect her discomfort is emanating from the anorectum.  It does not  sound as though she has got a fissure clinically and she was nontender  on digital rectal examination.   We really need to investigate her symptoms for particularly the  hematochezia.   The separate issue the patient has crescendo reflux symptoms over the  past 6 months and now has esophageal dysphagia to solids.  These  symptoms demand further investigations as well.   RECOMMENDATIONS:  I have offered the patient both an EGD with possible  esophageal dilation as appropriate and a diagnostic colonoscopy in the  very near future at the [facility].  Risk, benefits,  alternatives, and  limitations have been reviewed.  Her questions were answered.  She is  agreeable.  We will plan the evaluation in the very near future.  Further recommendations to follow.   I would like to thank Dr. Juanetta Gosling for allowing me to see this nice lady  today.      Jonathon Bellows, M.D.  Electronically Signed     RMR/MEDQ  D:  06/29/2007  T:  06/30/2007  Job:  [id number]   cc:   Ramon Dredge L. Juanetta Gosling, M.D.  Fax: [phone]

## 2010-06-24 NOTE — Op Note (Signed)
Beverly Young, Beverly Young                 ACCOUNT NO.:  [id number]   MEDICAL RECORD NO.:  [id number]          PATIENT TYPE:  AMB   LOCATION:  DAY                           FACILITY:  [facility]   PHYSICIAN:  R. Roetta Sessions, M.D. DATE OF BIRTH:  [birth year]   DATE OF PROCEDURE:  07/11/2007  DATE OF DISCHARGE:                               OPERATIVE REPORT   INDICATIONS FOR PROCEDURE:  A 60 year old lady with throbbing anorectal  sensation and intermittent hematochezia particularly noticed sometimes  after vigorous wiping, some difficulty with good evacuation, some  seepage following the bowel movement.  She also has a 76-month history of  worsening gastroesophageal reflux disease symptoms and intermittent  esophageal dysphagia to solids.  EGD with esophageal dilation and  colonoscopy now being done.  This approach has been discussed with the  patient.  Risks, benefits, alternatives, and limitations have been  reviewed.  All questions were answered.  All parties were agreeable.  Please see documentation in medical record.   PROCEDURE NOTE:  O2 saturation, blood pressure, pulse, and respirations  were monitored through the entire procedure.  Conscious sedation:  Versed 5 mg IV and Demerol 125 mg IV in divided  doses.  Cetacaine spray for topical pharyngeal anesthesia.   INSTRUMENT:  Pentax video chip system.   EGD FINDINGS:  Examination of the tubular esophagus revealed  circumferential and distal esophageal erosions within 1 cm of the EG  junction.  There was also a soft stricture/ring present.  There was no  Barrett esophagus or evidence of neoplasia.  EG junction was easily  traversed with the scope.  Stomach:  Gastric cavity was emptied and  insufflated well with air.  Thorough examination of the gastric mucosa  including retroflexed view of the proximal stomach esophagogastric  junction demonstrated small hiatal hernia and a couple of antral  erosions.  Otherwise, no ulcer infiltrating  process was seen.  Pylorus  was patent, easily traversed.  Examination of the bulb and second  portion revealed no abnormalities.   THERAPEUTIC/DIAGNOSTIC MANEUVERS PERFORMED:  The scope was withdrawn and  a 54-French Maloney dilators passed full insertion with ease.  A look  back revealed the ring had been dilated without apparent complication  with minimal bleeding.  The patient tolerated the procedure well, was  prepared for colonoscopy.   Digital rectal exam revealed single external hemorrhoidal tag.  Otherwise, was unremarkable.  Endoscope phalange prep was suboptimal on  the right side with a thin coating of stool covering a good bit, and the  colonic mucosa was more difficult to see on the right side.   Colon:  Colonic mucosa was surveyed from the rectosigmoid junction  through the left transverse and right colon to the appendiceal orifice,  ileocecal valve, and cecum.  These structures were well seen and  photographed for the record.  From this level, the scope was slowly  withdrawn.  All previous mucosal surfaces were again seen.  The patient  had scattered pancolonic diverticula, thin coating of the patient's  stool on the ascending segments during the exam more difficult to wash  off.  However, there was no evidence of obvious polyp or neoplastic  process.  Examination of the rectal mucosa including retroflex view of  the anal verge and possibly on the anal canal, there was friable anal  canal hemorrhoids with a prominent external tag.  I did not see a  fissure or other abnormality.  The patient tolerated both procedures  well as reactive in the endoscopy.   IMPRESSION:  1. EGD:  Circumferential distal esophageal erosions consistent with      erosive reflux esophagitis, untreated.  2. Soft peptic stricture/ring, status post dilation described above.      Otherwise, unremarkable esophagus, small hiatal hernia, antral      erosions otherwise normal gastric mucosa patent  pylorus.  Normal D1      and D2 colonoscopy findings.  External hemorrhoidal tag, anal canal      hemorrhoids friable, otherwise, normal rectum pancolonic      diverticula.  Remainder colon mucosa are grossly normal.      Suboptimal prep on the right side compromised the exam.   RECOMMENDATIONS:  1. Begin Prilosec 20 mg orally daily before breakfast and supper.      Antireflux literature provided to Ms. Perrelli.  She should lose some      weight.  2. Hemorrhoid diverticulosis literature provided to Ms. Meara, daily      Metamucil or Citrucel fiber supplement.  3. Ten-day course of Anusol-HC suppositories one per rectum at      bedtime.  4. Follow up with [place] in 6 weeks so we can assess her progress.      Jonathon Bellows, M.D.  Electronically Signed     RMR/MEDQ  D:  07/11/2007  T:  07/12/2007  Job:  [id number]   cc:   Ramon Dredge L. Juanetta Gosling, M.D.  Fax: [phone]

## 2010-06-24 NOTE — Assessment & Plan Note (Signed)
[facility]                       Beverly Young CARDIOLOGY OFFICE NOTE   Beverly Young                        MRN:          [id number]  DATE:08/17/2008                            DOB:          May 01, 1950    I was asked by Dr. Erby Pian to see Beverly Young with an abnormal EKG and  dyspnea on exertion.   Beverly Young is a 60 year old white female with multiple cardiac risk  factors.  We have seen her in the past for chest pain and palpitations.  Please refer to those notes.  At that time, stress Myoview or  Cardiolite, 2-D echocardiogram, and Holter monitor were unremarkable.   She had been having increasing dyspnea on exertion with walking.  She  has had no angina per se.  She denies orthopnea, PND, but does have some  peripheral edema.   She recent saw Dr. Erby Pian.  Because of this complaint, she was  referred over.   Her risk factors include age, obesity, sedentary lifestyle,  hypertension, and hyperlipidemia.   She does not smoke.   Her past medical history includes a history for no known drug allergies.   CURRENT MEDICATIONS:  1. Diovan HCT 320/25 mg daily.  2. Ramipril 10 mg per day.  3. Celebrex 200 mg every other day.  4. Omeprazole over-the-counter 20 mg daily.  5. Metoprolol succinate 100 mg daily.  6. Crestor 10 mg per day.  7. Glucosamine 1000 mg daily.  8. Calcium, Vitamin D, and Centrum Silver.   PAST SURGICAL HISTORY:  She has had neck surgery for ruptured disk.  She  has had colonoscopy in the past.   SOCIAL HISTORY:  She works part-time.  She is married and has 1 child.   FAMILY HISTORY:  Really negative for premature coronary artery disease  or mother did die of myocardial infarction in her late 73s.   REVIEW OF SYSTEMS:  She wears contacts.  She has some hearing loss on  the left ear.  She has a partial bottom plate or denture.  She has  reflux symptoms.  She has chronic arthritis.  Otherwise, her review of  systems  were all negative and questioned.   Of note, she tells me in retrospect she had a catheterization a couple  years ago by Dr. Sharyn Lull.  She was told she had a 10% stenosis at that  time.   PHYSICAL EXAMINATION:  VITAL SIGNS:  She is 5 feet 4-1/2 inches.  Weight  is 253 pounds.  Blood pressure 130/80 in the left arm, pulse 68 and  regular.  GENERAL:  She is very pleasant.  She is overweight.  She is no acute  distress.  HEENT:  She has a Youth worker red hair.  She has very fair complexion,  some freckles are present.  Sclerae are injected.  Dentition is  satisfactory.  Otherwise, HEENT is unremarkable.  NECK:  Supple.  Carotid upstrokes are equal bilaterally without bruits.  No JVD.  Thyroid is not enlarged.  Trachea is midline.  CHEST:  Lungs  are clear to auscultation and percussion.  HEART:  Poorly appreciated  PMI, soft, S1 and S2.  No obvious  abnormality.  ABDOMEN:  Obese with good bowel sounds.  No midline bruit.  No  hepatomegaly.  No tenderness.  EXTREMITIES:  Trace edema bilaterally.  Pulses are present.  NEUROLOGIC:  Intact.  MUSCULOSKELETAL:  Chronic arthritic changes.  SKIN:  Unremarkable except for the fair complexion and freckles.   EKG shows normal sinus rhythm with new T-wave changes with inversion in  the anterior leads.   ASSESSMENT:  Dyspnea on exertion with new EKG changes with multiple  cardiac risk factors, rule out progressive coronary artery disease.   PLAN:  Outpatient catheterization at [facility].  Indications, risks, and  potential benefits have been  discussed.  Pre-cath labs and chest x-ray will be obtained.  I will set  her up for followup after the catheterization.     Thomas C. Daleen Squibb, MD, [facility]  Electronically Signed    TCW/MedQ  DD: 08/17/2008  DT: 08/18/2008  Job #: [id number]   cc:   Franchot Heidelberg, M.D.

## 2010-06-27 NOTE — Consult Note (Signed)
NAMEMAHREEN, SCHEWE                 ACCOUNT NO.:  [id number]   MEDICAL RECORD NO.:  [id number]          PATIENT TYPE:  AMB   LOCATION:  DAY                           FACILITY:  [facility]   PHYSICIAN:  Lionel December, M.D.    DATE OF BIRTH:  03/10/1950   DATE OF CONSULTATION:  03/27/2004  DATE OF DISCHARGE:                                   CONSULTATION   DATE OF CONSULTATION:  March 27, 2004.   REASON FOR CONSULTATION:  Hematochezia.   PHYSICIAN REQUESTING CONSULTATION:  Dr. Lazaro Arms.   HISTORY OF PRESENT ILLNESS:  Vanellope is a 60 year old lady who recently had  several episodes of small-volume hematochezia.  When she saw Dr. Despina Hidden for  her yearly physical, the rectal examination revealed internal hemorrhoids.  Hemoccult stool was Hemoccult negative.  She generally has regular bowel  movements, although one time weekly she may have some loose stools.  When  this occurs she develops fecal urgency and will have two to four stools that  day.  She denies any melena, nausea, vomiting or heartburn.  She has had one  to two episodes yearly of becoming acutely dizzy associated with nausea and  vomiting.  She describes vertigo-type symptoms.  She is going to have  carotid ultrasounds in the near future as per Dr. Juanetta Gosling.  She has already  worn a Holter monitor for four weeks as directed by her cardiologist and has  been told she has irregular heartbeats.  She is on [facility] for this.  She  denies any dysphagia, odynophagia, heartburn.  She has lost 12 pounds in the  last 4 weeks, on a diet as recommended by her cardiologist.   CURRENT MEDICATIONS:  1.  Celebrex 200 mg q.o.d.  2.  Mavik 4 mg q.d.  3.  Lotrel 5/20 mg b.i.d.  4.  Lasix 40 mg q.d.  5.  Cozaar 100 mg q.d.  6.  Toprol XL 100 mg q.d.   ALLERGIES:  No known drug allergies.   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  She has also  had  cervical disk surgery, right knee arthroscopy, cesarean section, partial  hysterectomy.   FAMILY HISTORY:  Mother died of MI at age 47.  She had diabetes mellitus.  Her father was diagnosed with colorectal cancer at age 70 which was last  year.  She had a sister who recently had a colonoscopy and had three polyps.  Her sister also had cervical cancer.  She has a paternal aunt who had liver  cancer.   SOCIAL HISTORY:  She is married and has a son.  She is self employed as a  Associate Professor.  She has never been a smoker.  She denies any alcohol  use.   REVIEW OF SYSTEMS:  See HPI for GI and for constitutional.  CARDIOPULMONARY:  Denies any chest pain or shortness of breath.   PHYSICAL EXAMINATION:  VITAL SIGNS:  Weight 261, height 5 feet 5 inches,  temperature 97.7, blood pressure 152/90.  Pulse 64.  GENERAL:  A pleasant, morbidly obese Caucasian female in no acute distress.  SKIN:  Warm and dry.  No jaundice.  HEENT:  Pupils are equal, round and reactive to light.  Conjunctivae are  pink.  Sclerae are nonicteric.  The oropharyngeal mucosa moist and pink.  No  lesions, erythema or exudate.  NECK:  No lymphadenopathy or thyromegaly.  CHEST:  Lungs are clear to auscultation.  CARDIAC:  Reveals regular rate and rhythm with a normal S1, S2.  No murmurs,  rubs or gallops.  ABDOMEN:  Positive bowel sounds.  Soft.  Obese but symmetrical.  Nontender.  No organomegaly or masses.  EXTREMITIES:  No edema.   IMPRESSION:  The patient is a 60 year old lady with recent small-volume  hematochezia.  She has a family history significant for colorectal cancer.  She has never had a colonoscopy and needs to undergo a diagnostic  colonoscopy at this time.   PLAN:  1.  Colonoscopy in the near future.      LL/MEDQ  D:  03/27/2004  T:  03/27/2004  Job:  [id number]   cc:   Lazaro Arms, M.D.  [address]  [place]  [place] [address]  Fax: [phone]   Oneal Deputy. Juanetta Gosling, M.D.  [address]  [place]  [place] [address]  Fax: [phone]

## 2010-06-27 NOTE — Op Note (Signed)
[place]. [facility]  Patient:    Beverly Young, Beverly Young Visit Number: [id number] MRN: [id number]          Service Type: SUR Location: [id number] Attending Physician:  Cristi Loron Dictated by:   Cristi Loron, M.D. Proc. Date: 11/01/00 Admit Date:  11/01/2000                             Operative Report  PREOPERATIVE DIAGNOSES:  C6-7 herniated nucleus pulposus, spinal stenosis, degenerative disk disease, cervical radiculopathy, cervicalgia.  POSTOPERATIVE DIAGNOSES:  C6-7 herniated nucleus pulposus, spinal stenosis, degenerative disk disease, cervical radiculopathy, cervicalgia.  PROCEDURES:  C6-7 extensive anterior cervical diskectomy, interbody iliac crest allograft arthrodesis, anterior cervical plating (Codman titanium anterior plate and screws).  SURGEON:  Cristi Loron, M.D.  ASSISTANT:  Mena Goes. Franky Macho, M.D.  ANESTHESIA:  General endotracheal.  ESTIMATED BLOOD LOSS:  100 cc.  SPECIMENS:  None.  DRAINS:  None.  COMPLICATIONS:  None.  BRIEF HISTORY:  The patient is a 60 year old white female who has suffered from neck and right arm pain.  She has failed medical management and was worked up with a cervical MRI, which demonstrated a large herniated disk at C6-7 on the right.  The patients signs and symptoms and physical exam were consistent with a C7 radiculopathy.  She therefore weighed the risks, benefits, and alternatives of surgery and decided to proceed with an anterior cervical diskectomy and fusion and plating after she failed medical management.  DESCRIPTION OF PROCEDURE:  The patient was brought to the operating room by the anesthesia team.  General endotracheal anesthesia was induced.  The patient remained in the supine position.  A roll was placed under her shoulders to place her neck in slight extension.  Her anterior cervical region was then prepared with Betadine scrub and Betadine solution.  Sterile  drapes were applied, and I injected the area to be incised with Marcaine with epinephrine solution.  I used the scalpel to make a transverse incision in the patients left anterior neck.  I used the Metzenbaum scissors to divide the platysma muscle, and I dissected medial to the sternocleidomastoid, jugular vein, and carotid artery.  I bluntly dissected down toward the anterior cervical spine, carefully identifying the esophagus and retracting it medially.  I cleared the soft tissue from the anterior cervical spine using Kitner swabs, then obtained the intraoperative radiograph to confirm our location.  I then used electrocautery to detach the medial border of the longus colli muscle bilaterally from the C6 intervertebral disk space and inserted the Caspar self-retaining retractor for exposure.  I then incised the C6-7 intervertebral disk with a 15 blade scalpel and performed a partial diskectomy with the pituitary forceps and Karlin curettes.  I inserted distraction screws at C6 and C7, distracted the interspace, and then I used the high-speed drill to decorticate the vertebral end plates at Y7-8 and drill away the remainder of the intervertebral disk.  I thinned out the posterior longitudinal ligament with the drill and incised it with the arachnoid knife and removed the ligament with a Kerrison punch, undercutting the vertebral end plates at G9-5, decompressing the thecal sac.  There was a large right herniated nucleus pulposus compressing the right C7 nerve root.  I removed it using the Kerrison punch and the pituitary forceps.  I performed a generous foraminotomy about the bilateral C7 nerve root, and then I palpated about the bilateral nerve  roots with the micro-nerve hook and noted that the neural structures were well-decompressed.  I now turned my attention to the arthrodesis.  I obtained an iliac crest allograft bone graft and fashioned it to these approximate dimensions:  7 mm in  height, 1 cm in depth.  I inserted the bone graft into the distracted C6-7 interspace and then removed the distraction screws.  There was a good, snug fit of the bone graft.  I now turned my attention to the anterior spinal instrumentation.  I obtained the appropriate length Codman anterior cervical plate, laid it along the anterior aspect of C6 and C7, drilled two holes at C6, two at C7, tapped the holes, and secured the plate to the vertebral bodies with 12 mm screws, i.e., two at each vertebra.  I did not shoot another x-ray, as we could not see C6-7, did see C6-7 on the x-ray we got during the beginning of the surgery. The screws looked good in vivo, and therefore I secured the screws to the plate using the cam tightener at each screw.  I then copiously irrigated the wound with bacitracin solution, removed the solution.  I achieved stringent hemostasis with bipolar electrocautery and Gelfoam.  I then removed the Caspar self-retaining retractor and inspected the esophagus for any damage.  There was none apparent.  I then reapproximated the patients platysma muscle with interrupted 3-0 Vicryl suture and the subcutaneous tissue with interrupted 3-0 Vicryl suture.  The skin was reapproximated with Steri-Strips and benzoin, and the wound was then coated with bacitracin ointment, a sterile dressing applied. The drapes were removed.  The patient was subsequently extubated by the anesthesia team and transported to the postanesthesia care unit in stable condition.  All sponge, instrument, and needle counts were correct at the end of this case. Dictated by:   Cristi Loron, M.D. Attending Physician:  Tressie Stalker D DD:  11/01/00 TD:  11/02/00 Job: [id number]

## 2010-06-27 NOTE — Procedures (Signed)
   NAME:  Beverly Young, Beverly Young                           ACCOUNT NO.:  [id number]   MEDICAL RECORD NO.:  [id number]                   PATIENT TYPE:  OUT   LOCATION:  RAD                                  FACILITY:  [facility]   PHYSICIAN:  Beverly C. Wall, M.D. [facility]            DATE OF BIRTH:  1950/06/04   DATE OF PROCEDURE:  05/10/2002  DATE OF DISCHARGE:                                  ECHOCARDIOGRAM   TWO-DIMENSIONAL ECHOCARDIOGRAM:   INDICATIONS:  Palpitations 785.1.   QUALITY OF STUDY:  The echocardiogram is of suboptimal technical quality.   CONCLUSIONS:  1. Minimal left atrial enlargement.  2. Normal left ventricular chamber size and overall systolic function.     Ejection fraction greater than or equal to 60%.  3. No segmental wall motion abnormalities.  4. No evidence of left ventricular hypertrophy.  5. No important valve abnormalities.  6. Normal right-sided structures and function.                                               Beverly Young, M.D. [facility]    TCW/MEDQ  D:  05/10/2002  T:  05/11/2002  Job:  [id number]   cc:   Ramon Dredge L. Juanetta Gosling, M.D.  [address]  [place]  [place] [address]  Fax: [phone]

## 2010-06-27 NOTE — H&P (Signed)
[place]. [facility]  Patient:    Beverly Young, Beverly Young Visit Number: [id number] MRN: [id number]          Service Type: Attending:  Cristi Loron, M.D. Dictated by:   Cristi Loron, M.D. Adm. Date:  11/01/00                           History and Physical  CHIEF COMPLAINT: Right arm pain.  HISTORY OF PRESENT ILLNESS: The patient is a 60 year old, white female, who suffered from approximately 6 weeks of severe right arm pain.  She has been gradually worked up with cervical MRI, which demonstrated a large herniated disk at C6-7 on right. She complains of pain that radiates down the back of her arm with numbness and tingling in her thumb, index and middle finger on her right with some right arm weakness.  PAST MEDICAL HISTORY: Positive for hypertension, intracerebral hemorrhage in 1993. She has had two arteriograms, which were negative.  She has recently had a bout of tinnitus and vertigo and has been evaluated by an otolaryngologist for this.  MEDICATIONS PRIOR TO ADMISSION: 1. Celebrex 1 p.o. q.d. 2. Hydrochlorothiazide 1 p.o. q.d. 3. Premarin 1 p.o. q.d. 4. New blood pressure medication she cannot remember the name of 1 p.o. q.d.  ALLERGIES: She has no known drug allergies.  FAMILY MEDICAL HISTORY: The patients mother died, age 66 of diabetes mellitus and myocardial infarction. The patients father is age 57 in good health except for hypertension. She has aunts with unknown types of cancer.  She has a sister with cervical cancer.  SOCIAL HISTORY: The patient is married, she has no children.  She lives in [place]. She is not employed. She denies tobacco, ethanol and drug use.  REVIEW OF SYSTEMS: Negative except as above.  PHYSICAL EXAMINATION:  GENERAL: A pleasant, obese, 60 year old female complaining of right arm pain.  VITAL SIGNS: Height 5 feet 5 inches, weight 210 pounds.  HEENT: Normal.  NECK: Supple. There is no masses,  deformities, tracheal deviation, jugular venous distension, or carotid bruits.  She has a limited cervical range of motion. Spurling test is positive on the right and negative on the left. Lhermitte sign was not present. Thorax is symmetric.  LUNGS: Clear to auscultation.  HEART: Regular rate and rhythm.  ABDOMEN: Soft, nontender, obese.  EXTREMITIES: Obese, otherwise normal.  NEUROLOGICAL: The patient is alert and oriented x3.  Cranial nerves II-XII are grossly intact bilaterally except she has some decreased hearing in her left ear. Cerebellar examination is intact to rapid alternating movements of the upper extremities bilaterally. Sensory examination demonstrates decreased sensation in the right C6 or C7 distribution. Deep tendon reflexes are 2/4 in her bilateral biceps, brachioradialis, quadriceps, gastrocnemius, and left tricepss, 1/4 in her right tricep.  Motor strength is 5/5 in her bilateral deltoids, biceps, wrist extensor, interosseous hand grip, psoas, quadriceps, gastrocnemius, extensor hallucis longus, and left tricepss. Her right tricep strength is diminished at 4/5.  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  She has a large herniated disk at C6-7 on her right.  ASSESSMENT AND PLAN: C6-7 herniated nucleus pulposus, spinal stenosis, cervical radiculopathy, cervicalgia, degenerative disk disease.  I discussed the situation with the patient, reviewed her MRI scan with her, pointed out the abnormalities her signs and symptoms on physical examination persistent with a  right C7 radiculopathy caused by the herniated disk at C6-7. I have discussed the various treatment options with her including doing noting, continuing medical management and surgery.  I have described a C6-7 anterior cervical diskectomy, interbody iliac  crest allograft arthrodesis with or without anterior cervical plating.  I have shown her surgical models.  I discussed the risk of surgery extensively.  The patient has weighed the risks, benefits and alternatives of surgery and wants to proceed with the C6-7 anterior cervical diskectomy, interbody iliac crest allograft arthrodesis and anterior cervical plating on November 01, 2000. Dictated by:   Cristi Loron, M.D. Attending:  Cristi Loron, M.D. DD:  11/01/00 TD:  11/01/00 Job: [id number]

## 2010-06-27 NOTE — Cardiovascular Report (Signed)
Beverly Young, CURBOW                 ACCOUNT NO.:  [id number]   MEDICAL RECORD NO.:  [id number]          PATIENT TYPE:  OIB   LOCATION:  6501                         FACILITY:  [facility]   PHYSICIAN:  Mohan N. Sharyn Lull, M.D. DATE OF BIRTH:  09-28-1950   DATE OF PROCEDURE:  05/01/2004  DATE OF DISCHARGE:                              CARDIAC CATHETERIZATION   PROCEDURE:  Left cardiac catheterization with selective left and right  coronary angiography, left ventriculography via right groin using Judkins  technique.   INDICATIONS FOR PROCEDURE:  [name] is 60 year old, white female with past  medical history significant for hypertension, degenerative joint disease,  history of cerebral hemorrhage, morbid obesity, positive family history of  coronary artery disease who complains of vague retrosternal chest pain  associated with left arm weakness and mild shortness of breath.  The patient  also complains of exertional dyspnea with minimal exertion.  Denies any  nausea, vomiting or diaphoresis.  Denies PND, orthopnea and leg swelling.  Denies palpitation, lightheadedness or syncope.  The patient underwent  Persantine Myoview on April 18, 2004, which showed mild basilar half of  anterior wall ischemia with EF of 58%.  Due to multiple risk factors, chest  pain and mildly positive Persantine Myoview, discussed with the patient  regarding left catheterization, its risks and benefits and consented for the  procedure.   DESCRIPTION OF PROCEDURE:  After after obtaining informed consent, the  patient was brought to the catheterization lab and was placed on fluoroscopy  table.  Right groin was prepped and draped in usual fashion.  Xylocaine 2%  was used for local anesthesia in the right groin.  With the help of thin-  walled needle, a 4 French arterial sheath was placed.  The sheath was  aspirated and flushed.  Next, 4 French left Judkins catheter was advanced  over the wire under fluoroscopic guidance  up to the ascending aorta.  Wire  was pulled out.  The catheter was aspirated and connected to the manifold.  Catheter was further advanced and engaged into left coronary ostium.  Multiple views of the left system were taken.  Next, catheter was disengaged  and was pulled out over the wire and was replaced with 4 French right  Judkins catheter which was advanced over the wire under fluoroscopic  guidance up to the ascending aorta.  Wire was pulled out.  The catheter was  aspirated and connected to the manifold.  Catheter was further advanced and  engaged into right coronary ostium.  Multiple views of the right system were  taken.  Next, the catheter was disengaged and was pulled out over the wire  and was replaced with 4 French pigtail catheter which was advanced over the  wire under fluoroscopic guidance up to the ascending aorta.  Wire was pulled  out.  The catheter was aspirated and connected to the manifold.  Catheter  was further advanced across the aortic valve into the LV.  LV pressures were  recorded.  Next, LV-graphy was done in 30 degree RAO position.  Post  angiographic pressures were recorded from LV  and then pullback pressures  were recorded from the aorta.  There was no gradient across aortic valve.  Next, the pigtail catheter was pulled out over the wire.  Sheaths were  aspirated and flushed.   FINDINGS:  1.  LV showed good LV systolic function, EF of 55-60%.  Left main was      patent.  2.  LAD has 10-15% mid stenosis.  3.  Diagonal-1 to diagonal-3 were very small which were patent.  4.  Left circumflex was large proximally which was patent and then tapers      down in AV groove after giving off large OM-2.  5.  OM-1 was very small which was patent.  OM-2 was large which was patent.      RCA was patent.  The patient has right dominant coronary system.   The patient tolerated procedure well.  There are no complications. The  patient was transferred to recovery room in  stable condition.      MNH/MEDQ  D:  05/01/2004  T:  05/01/2004  Job:  [id number]   cc:   Catheterization Lab

## 2010-06-27 NOTE — Op Note (Signed)
NAMEELLIOT, SIMONEAUX                 ACCOUNT NO.:  [id number]   MEDICAL RECORD NO.:  [id number]          PATIENT TYPE:  AMB   LOCATION:  DAY                           FACILITY:  [facility]   PHYSICIAN:  Lionel December, M.D.    DATE OF BIRTH:  12-Jan-1951   DATE OF PROCEDURE:  04/15/2004  DATE OF DISCHARGE:                                 OPERATIVE REPORT   PROCEDURE:  Total colonoscopy with polypectomy.   INDICATION:  Beverly Young is a 59 year old Caucasian female with intermittent  hematochezia who is undergoing diagnostic colonoscopy.  Family history is  significant for colon carcinoma in her father, who had it diagnosed at age  48 and is doing well.  Her younger sister has had colonic polyps removed.  Family history is also significant for other malignancies.  The procedure  risks were reviewed the patient and informed consent was obtained.   PREMEDICATION:  Demerol 50 mg IV, Versed 8 mg IV.   FINDINGS:  Procedure performed in endoscopy suite.  The patient's vital  signs and O2 saturation were monitored during procedure and remained stable.  The patient was placed in the left lateral position and rectal examination  performed.  No abnormality noted on external or digital exam.  The Olympus  video scope was placed in the rectum and advanced under vision into sigmoid  colon and beyond.  Preparation was excellent.  She had few very small  diverticula at sigmoid and transverse colon.  The scope was passed to cecum,  which was identified by appendiceal orifice and ileocecal valve.  Pictures  taken for the record.  As the scope was withdrawn, colonic mucosa was once  again carefully examined.  There was a 5-6 mm polyp at rectum, which  appeared to be adenomatous.  This was snared and retrieved for histologic  examination.  The scope was retroflexed to examine anorectal junction and  small hemorrhoids were noted below the dentate line.  Endoscope was  straightened and withdrawn.  The patient tolerated  the procedure well.   FINAL DIAGNOSIS:  1.  A 5-6 mm polyp snared from the rectum.  2.  Few small diverticula at sigmoid and transverse colon.  3.  Small external hemorrhoids.   RECOMMENDATIONS:  1.  Standard instructions given.  2.  High-fiber diet.  3.  Citrucel or equivalent one tablespoonful daily.  4.  I will be contacting the patient with biopsy results.  Given today's      findings and family history, she should return for repeat colonoscopy in      five years from now.      NR/MEDQ  D:  04/15/2004  T:  04/15/2004  Job:  [id number]   cc:   Lazaro Arms, M.D.  [address]  [place]  [place] [address]  Fax: [phone]   Oneal Deputy. Juanetta Gosling, M.D.  [address]  [place]  [place] [address]  Fax: [phone]

## 2010-07-23 ENCOUNTER — Other Ambulatory Visit (HOSPITAL_COMMUNITY): Payer: Self-pay | Admitting: Family Medicine

## 2010-07-23 DIAGNOSIS — Z139 Encounter for screening, unspecified: Secondary | ICD-10-CM

## 2010-07-29 ENCOUNTER — Ambulatory Visit (HOSPITAL_COMMUNITY): Payer: Self-pay

## 2010-08-05 ENCOUNTER — Ambulatory Visit (HOSPITAL_COMMUNITY)
Admission: RE | Admit: 2010-08-05 | Discharge: 2010-08-05 | Disposition: A | Discharge status: Home / Self Care | Payer: PRIVATE HEALTH INSURANCE | Source: Ambulatory Visit | Attending: Family Medicine | Admitting: Family Medicine

## 2010-08-05 DIAGNOSIS — Z139 Encounter for screening, unspecified: Secondary | ICD-10-CM

## 2010-08-05 DIAGNOSIS — Z1231 Encounter for screening mammogram for malignant neoplasm of breast: Secondary | ICD-10-CM | POA: Insufficient documentation

## 2010-09-30 ENCOUNTER — Other Ambulatory Visit: Payer: Self-pay | Admitting: Gastroenterology

## 2011-01-14 ENCOUNTER — Encounter: Payer: Self-pay | Admitting: Internal Medicine

## 2011-01-15 ENCOUNTER — Encounter: Payer: Self-pay | Admitting: Urgent Care

## 2011-01-15 ENCOUNTER — Ambulatory Visit (INDEPENDENT_AMBULATORY_CARE_PROVIDER_SITE_OTHER): Payer: PRIVATE HEALTH INSURANCE | Admitting: Urgent Care

## 2011-01-15 DIAGNOSIS — R1013 Epigastric pain: Secondary | ICD-10-CM | POA: Insufficient documentation

## 2011-01-15 DIAGNOSIS — R110 Nausea: Secondary | ICD-10-CM

## 2011-01-15 DIAGNOSIS — K219 Gastro-esophageal reflux disease without esophagitis: Secondary | ICD-10-CM

## 2011-01-15 LAB — CBC WITH DIFFERENTIAL/PLATELET
Basophils Absolute: 0 10*3/uL (ref 0.0–0.1)
Basophils Relative: 0 % (ref 0–1)
Eosinophils Relative: 2 % (ref 0–5)
HCT: 37.8 % (ref 36.0–46.0)
Lymphocytes Relative: 33 % (ref 12–46)
MCHC: 33.9 g/dL (ref 30.0–36.0)
Monocytes Absolute: 0.5 10*3/uL (ref 0.1–1.0)
Neutro Abs: 5.4 10*3/uL (ref 1.7–7.7)
Platelets: 258 10*3/uL (ref 150–400)
RDW: 13.5 % (ref 11.5–15.5)
WBC: 9.1 10*3/uL (ref 4.0–10.5)

## 2011-01-15 MED ORDER — DEXLANSOPRAZOLE 60 MG PO CPDR: 60.0000 mg | DELAYED_RELEASE_CAPSULE | Freq: Every day | ORAL | Status: AC

## 2011-01-15 NOTE — Patient Instructions (Signed)
Stop Naprosyn Use Tylenol arthritis as directed instead Stop omeprazole Begin Dexilant 60 mg daily Go get your lab work and we will call you with the results.

## 2011-01-15 NOTE — Progress Notes (Signed)
Referring Provider: Isabella Stalling, MD Primary Care Physician:  Isabella Stalling, MD Primary Gastroenterologist:  Dr. Ane Payment  Chief Complaint  Patient presents with  . Abdominal Pain    HPI:  Beverly Young is a 60 y.o. female here for abdominal pain & has hx of complicated GERD.  C/o nausea even before eating daily.  C/o pain in epigastrum for a couple months now.  Pain is intermittent, lasts all day, sometimes worse w/ eating.  Denies vomiting.  On Naprosyn a couple years Rx by Dr Janna Arch for arthritis.  Tried Prilosec 20mg  daily for 2 days & had horrible heartburn.  Had flu symptoms for 4 days last week w/ cough, malaise, fever.  Past Medical History  Diagnosis Date  . HTN (hypertension)   . 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 Esophagogastroduodenoscopy 07/11/07    circumferential distal esophageal ersionc/soft peptic ring/small hiatal herina  . Colonoscopy 07/11/07    external hemorrhoidal otherwise normal rectum pancolonic diverticula, suboptimal prep on the right  . Cesarean section   . Partial hysterectomy   . Knee surgery     right    Current Outpatient Prescriptions  Medication Sig Dispense Refill  . Calcium Carbonate-Vitamin D (CALCIUM-VITAMIN D) 500-200 MG-UNIT per tablet Take 1 tablet by mouth daily.        . Glucosamine Sulfate 1000 MG CAPS Take 1 capsule by mouth daily.        . metoprolol (LOPRESSOR) 50 MG tablet Take 50 mg by mouth 2 (two) times daily.        . multivitamin-iron-minerals-folic acid (CENTRUM) chewable tablet Chew 1 tablet by mouth daily.        . naproxen (NAPROSYN) 500 MG tablet Take 500 mg by mouth 2 (two) times daily with a meal.        . omeprazole (PRILOSEC) 20 MG capsule TAKE ONE CAPSULE BY MOUTH EVERY DAY  30 capsule  11  . penicillin v potassium (VEETID) 500 MG tablet Take 500 mg by mouth daily.        . rosuvastatin (CRESTOR) 10 MG tablet Take 10 mg by mouth daily.        .  valsartan-hydrochlorothiazide (DIOVAN-HCT) 320-25 MG per tablet Take 1 tablet by mouth daily.          Allergies as of 01/15/2011 - Review Complete 01/15/2011  Allergen Reaction Noted  . Codeine Other (See Comments) 01/15/2011    Review of Systems: Gen:  See HPI CV: Denies chest pain, angina, palpitations, syncope, orthopnea, PND, peripheral edema, and claudication. Resp: Denies dyspnea at rest, dyspnea with exercise, cough, sputum, wheezing, coughing up blood, and pleurisy. GI: Denies vomiting blood, jaundice, and fecal incontinence.   Denies dysphagia or odynophagia. Derm: Denies rash, itching, dry skin, hives, moles, warts, or unhealing ulcers.  Psych: Denies depression, anxiety, memory loss, suicidal ideation, hallucinations, paranoia, and confusion. Heme: Denies bruising, bleeding, and enlarged lymph nodes.  Physical Exam: BP 104/71  Pulse 75  Temp(Src) 98.6 F (37 C) (Temporal)  Ht 5\' 4"  (1.626 m)  Wt 260 lb 3.2 oz (118.026 kg)  BMI 44.66 kg/m2 General:   Alert,  Well-developed, obese, pleasant and cooperative in NAD Head:  Normocephalic and atraumatic. Eyes:  Sclera clear, no icterus.   Conjunctiva pink. Mouth:  No deformity or lesions, oropharynx pink and moist. Neck:  Supple; no masses or thyromegaly. Heart:  Regular rate and rhythm; no murmurs, clicks, rubs,  or gallops. Abdomen:  Obese.  Soft, nontender and nondistended. No masses, hepatosplenomegaly or hernias noted. Normal bowel sounds, without guarding, and without rebound. Negative Murphy's sign.  Msk:  Symmetrical without gross deformities. Normal posture. Pulses:  Normal pulses noted. Extremities:  Without clubbing or edema. Neurologic:  Alert and  oriented x4;  grossly normal neurologically. Skin:  Intact without significant lesions or rashes. Cervical Nodes:  No significant cervical adenopathy. Psych:  Alert and cooperative. Normal mood and affect.

## 2011-01-16 LAB — URINALYSIS W MICROSCOPIC + REFLEX CULTURE
Bacteria, UA: NONE SEEN
Casts: NONE SEEN
Crystals: NONE SEEN
Hgb urine dipstick: NEGATIVE
Ketones, ur: NEGATIVE mg/dL
Specific Gravity, Urine: 1.027 (ref 1.005–1.030)
pH: 5.5 (ref 5.0–8.0)

## 2011-01-16 LAB — COMPREHENSIVE METABOLIC PANEL
ALT: 25 U/L (ref 0–35)
AST: 31 U/L (ref 0–37)
Alkaline Phosphatase: 43 U/L (ref 39–117)
BUN: 34 mg/dL — ABNORMAL HIGH (ref 6–23)
Chloride: 102 mEq/L (ref 96–112)
Creat: 1.17 mg/dL — ABNORMAL HIGH (ref 0.50–1.10)
Potassium: 3.9 mEq/L (ref 3.5–5.3)

## 2011-01-16 LAB — TSH: TSH: 2.077 u[IU]/mL (ref 0.350–4.500)

## 2011-01-16 LAB — LIPASE: Lipase: 54 U/L (ref 0–75)

## 2011-01-17 ENCOUNTER — Encounter: Payer: Self-pay | Admitting: Urgent Care

## 2011-01-17 NOTE — Assessment & Plan Note (Signed)
See "epigastric pain"

## 2011-01-17 NOTE — Assessment & Plan Note (Addendum)
Beverly Young is a pleasant 60 y.o. female with hx chronic complicated GERD w/ esophagitis/peptic stricture who present w/ 2-3 mo hx of epigastric pain & nausea.  Differentials include NSAID-induced gastritis, esophagitis, PUD & less likely gallbladder disease or pancreatitis.    CBC, LFTS, amylase, lipase Stop Naprosyn Use Tylenol arthritis as directed instead Stop omeprazole Begin Dexilant 60 mg daily We will call with the results to get a progress report & consider EGD versus imaging To ER if severe pain

## 2011-01-19 NOTE — Progress Notes (Signed)
Quick Note:  Please call patient. Her lab work shows normal blood count, no evidence of pancreatitis, however her kidney function is a bit elevated. This can happen with dehydration, please be sure she is drinking plenty of fluids and keeping them down. She should followup with her PCP for this. Need a progress report. WJ:[id number] M, MD  ______

## 2011-01-20 NOTE — Progress Notes (Signed)
Cc to PCP 

## 2011-01-20 NOTE — Progress Notes (Signed)
Quick Note:  Pt aware. She said since she stopped taking the naproxen she is feeling much better. She also said she had the flu the week before she came here and feels that may be why she was dehydrated. She said she will increase her fluid intake.  Please cc pcp. ______

## 2011-01-20 NOTE — Progress Notes (Signed)
Results Cc to PCP  

## 2011-01-20 NOTE — Progress Notes (Signed)
Quick Note:  OK, FU OV in 4-6 weeks w/ me on RMR day Thanks ______

## 2011-01-22 NOTE — Progress Notes (Signed)
REVIEWED.  

## 2011-01-22 NOTE — Progress Notes (Addendum)
Discussed timing next colonoscopy w/ Dr Jena Gauss.  Recommends 07/2012 due to poor prep.  Please NIC.

## 2011-01-26 ENCOUNTER — Encounter: Payer: Self-pay | Admitting: Internal Medicine

## 2011-01-28 NOTE — Progress Notes (Signed)
Reminder in epic to have TCS in June 2014

## 2011-01-28 NOTE — Progress Notes (Signed)
Did 07/2012 TCS get NIC in EPIC?  Please be sure.  Thanks

## 2011-02-18 ENCOUNTER — Encounter: Payer: Self-pay | Admitting: Internal Medicine

## 2011-03-05 ENCOUNTER — Ambulatory Visit: Payer: PRIVATE HEALTH INSURANCE | Admitting: Gastroenterology

## 2011-03-06 ENCOUNTER — Ambulatory Visit: Payer: PRIVATE HEALTH INSURANCE | Admitting: Urgent Care

## 2011-03-17 ENCOUNTER — Ambulatory Visit: Payer: PRIVATE HEALTH INSURANCE | Admitting: Gastroenterology

## 2011-03-30 ENCOUNTER — Encounter: Payer: Self-pay | Admitting: Gastroenterology

## 2011-03-30 ENCOUNTER — Ambulatory Visit (INDEPENDENT_AMBULATORY_CARE_PROVIDER_SITE_OTHER): Payer: PRIVATE HEALTH INSURANCE | Admitting: Gastroenterology

## 2011-03-30 DIAGNOSIS — K219 Gastro-esophageal reflux disease without esophagitis: Secondary | ICD-10-CM

## 2011-03-30 DIAGNOSIS — R1310 Dysphagia, unspecified: Secondary | ICD-10-CM

## 2011-03-30 DIAGNOSIS — R1314 Dysphagia, pharyngoesophageal phase: Secondary | ICD-10-CM

## 2011-03-30 NOTE — Progress Notes (Signed)
Faxed to PCP

## 2011-03-30 NOTE — Progress Notes (Signed)
Primary Care Physician: Isabella Stalling, MD, MD  Primary Gastroenterologist:  Roetta Sessions, MD   Chief Complaint  Patient presents with  . Follow-up    HPI: Beverly Young is a 61 y.o. female here for f/u. Her abdominal pain resolved after stopping naprosyn. She could not afford the Dexilant. She states she is doing well on omeprazole. No heartburn, n/v, abd pain. BM regular. No melena, brbpr.   Occasional dysphagia to solid foods for one month. Happens when eating fast.   Current Outpatient Prescriptions  Medication Sig Dispense Refill  . Calcium Carbonate-Vitamin D (CALCIUM-VITAMIN D) 500-200 MG-UNIT per tablet Take 1 tablet by mouth daily.        . Glucosamine Sulfate 1000 MG CAPS Take 1 capsule by mouth daily.        . metoprolol (LOPRESSOR) 50 MG tablet Take 50 mg by mouth 2 (two) times daily.        . multivitamin-iron-minerals-folic acid (CENTRUM) chewable tablet Chew 1 tablet by mouth daily.        . naproxen (NAPROSYN) 500 MG tablet Take 500 mg by mouth 2 (two) times daily with a meal. Pt taking once daily      . omeprazole (PRILOSEC) 20 MG capsule Take 20 mg by mouth daily.        . penicillin v potassium (VEETID) 500 MG tablet Take 500 mg by mouth daily.        . rosuvastatin (CRESTOR) 10 MG tablet Take 10 mg by mouth daily.        . valsartan-hydrochlorothiazide (DIOVAN-HCT) 320-25 MG per tablet Take 1 tablet by mouth daily.          Allergies as of 03/30/2011 - Review Complete 03/30/2011  Allergen Reaction Noted  . Codeine Other (See Comments) 01/15/2011    ROS:  General: Negative for anorexia, weight loss, fever, chills, fatigue, weakness. ENT: Negative for hoarseness, nasal congestion. CV: Negative for chest pain, angina, palpitations, dyspnea on exertion, peripheral edema.  Respiratory: Negative for dyspnea at rest, dyspnea on exertion, cough, sputum, wheezing.  GI: See history of present illness. GU:  Negative for dysuria, hematuria, urinary incontinence,  urinary frequency, nocturnal urination.  Endo: Negative for unusual weight change.    Physical Examination:   BP 120/80  Pulse 75  Temp(Src) 97.6 F (36.4 C) (Temporal)  Ht 5' 4.5" (1.638 m)  Wt 265 lb 12.8 oz (120.566 kg)  BMI 44.92 kg/m2  General: Well-nourished, well-developed in no acute distress.  Eyes: No icterus. Mouth: Oropharyngeal mucosa moist and pink , no lesions erythema or exudate. Lungs: Clear to auscultation bilaterally.  Heart: Regular rate and rhythm, no murmurs rubs or gallops.  Abdomen: Bowel sounds are normal, nontender, nondistended, no hepatosplenomegaly or masses, no abdominal bruits or hernia , no rebound or guarding.   Extremities: No lower extremity edema. No clubbing or deformities. Neuro: Alert and oriented x 4   Skin: Warm and dry, no jaundice.   Psych: Alert and cooperative, normal mood and affect.

## 2011-03-30 NOTE — Assessment & Plan Note (Signed)
Epigastric pain gone. Likely secondary to naprosyn. GERD well-controlled. Some vague rare solid food dysphagia in setting of previous peptic stricture requiring dilation in past. Patient states she does not want to pursue EGD/ED at this point. If symptoms get worse, she will call. Otherwise, OV in six months. Continue omeprazole 20mg  daily. Avoid naprosyn.

## 2011-03-30 NOTE — Patient Instructions (Signed)
Office visit in six months. Please call if you have more difficulty swallowing food.  Continue omeprazole daily.  Avoid naprosyn.

## 2011-04-15 NOTE — Progress Notes (Signed)
REVIEWED.  

## 2011-09-10 ENCOUNTER — Encounter: Payer: Self-pay | Admitting: Internal Medicine

## 2011-10-13 ENCOUNTER — Other Ambulatory Visit: Payer: Self-pay | Admitting: Internal Medicine

## 2011-10-14 ENCOUNTER — Telehealth: Payer: Self-pay | Admitting: *Deleted

## 2011-10-14 MED ORDER — OMEPRAZOLE 20 MG PO CPDR: 20.0000 mg | DELAYED_RELEASE_CAPSULE | Freq: Every day | ORAL | Status: DC

## 2011-10-14 NOTE — Telephone Encounter (Signed)
Pt is taking omeprazole and uses [facility].

## 2011-10-14 NOTE — Telephone Encounter (Signed)
Completed.

## 2011-10-14 NOTE — Telephone Encounter (Signed)
Ms Laidler called today to make an appt and also is out of her heart burn medicine and needs a refill. Please call her back. Thanks.

## 2011-10-22 ENCOUNTER — Ambulatory Visit: Payer: PRIVATE HEALTH INSURANCE | Admitting: Gastroenterology

## 2011-10-29 ENCOUNTER — Ambulatory Visit: Payer: PRIVATE HEALTH INSURANCE | Admitting: Gastroenterology

## 2011-11-09 ENCOUNTER — Encounter: Payer: Self-pay | Admitting: Gastroenterology

## 2011-11-09 ENCOUNTER — Ambulatory Visit (INDEPENDENT_AMBULATORY_CARE_PROVIDER_SITE_OTHER): Payer: PRIVATE HEALTH INSURANCE | Admitting: Gastroenterology

## 2011-11-09 VITALS — BP 129/54 | HR 61 | Temp 98.0°F | Ht 64.5 in | Wt 267.6 lb

## 2011-11-09 DIAGNOSIS — R1310 Dysphagia, unspecified: Secondary | ICD-10-CM

## 2011-11-09 DIAGNOSIS — K219 Gastro-esophageal reflux disease without esophagitis: Secondary | ICD-10-CM

## 2011-11-09 DIAGNOSIS — Z800 Family history of malignant neoplasm of digestive organs: Secondary | ICD-10-CM

## 2011-11-09 DIAGNOSIS — R1314 Dysphagia, pharyngoesophageal phase: Secondary | ICD-10-CM

## 2011-11-09 NOTE — Assessment & Plan Note (Signed)
Well-controlled GERD and intermittent dysphagia to potatoes and meats. Patient not ready to have EGD/ED. Will bring her back in six months and plan on EGD/ED at that time along with colonoscopy. Although her FH of CRC is in first degree relative, it was in the 8th or 9th decade of life. However, she did have a poor bowel prep and would go ahead and offer TCS next year.   Patient to call sooner if needed for worsening dysphagia symptoms.

## 2011-11-09 NOTE — Progress Notes (Signed)
Faxed to PCP

## 2011-11-09 NOTE — Progress Notes (Signed)
Primary Care Physician: Isabella Stalling, MD  Primary Gastroenterologist:  Roetta Sessions, MD   Chief Complaint  Patient presents with  . Follow-up    HPI: Beverly Young is a 61 y.o. female here for f/u GERD. She was last seen in 03/2011. At that time she was doing well on omeprazole. She c/o rare dysphagia to solid foods especially if eating too fast.   Has more trouble with potatoes and meat. No heartburn. No odynophagia. BM regular. No melena. No abdominal pain. No unintentional weight loss. Rare episode of brbpr due to hemorrhoids.   She has h/o colon cancer in first degree relative at advanced age. Her last TCS was in 2009 but prep was poor and exam compromised. H/O inflammatory polyp at time of first TCS in 2006.     Current Outpatient Prescriptions  Medication Sig Dispense Refill  . Calcium Carbonate-Vitamin D (CALCIUM-VITAMIN D) 500-200 MG-UNIT per tablet Take 1 tablet by mouth daily.        . Glucosamine Sulfate 1000 MG CAPS Take 1 capsule by mouth daily.        . metoprolol (LOPRESSOR) 50 MG tablet Take 50 mg by mouth 2 (two) times daily.        . multivitamin-iron-minerals-folic acid (CENTRUM) chewable tablet Chew 1 tablet by mouth daily.        Marland Kitchen omeprazole (PRILOSEC) 20 MG capsule TAKE ONE CAPSULE BY MOUTH EVERY DAY  30 capsule  11  . penicillin v potassium (VEETID) 500 MG tablet Take 500 mg by mouth daily.        . rosuvastatin (CRESTOR) 10 MG tablet Take 10 mg by mouth daily.        . valsartan-hydrochlorothiazide (DIOVAN-HCT) 320-12.5 MG per tablet Take 1 tablet by mouth daily.       Marland Kitchen DISCONTD: omeprazole (PRILOSEC) 20 MG capsule Take 1 capsule (20 mg total) by mouth daily.  30 capsule  1  . DISCONTD: valsartan-hydrochlorothiazide (DIOVAN-HCT) 320-25 MG per tablet Take 1 tablet by mouth daily.          Allergies as of 11/09/2011 - Review Complete 11/09/2011  Allergen Reaction Noted  . Codeine Other (See Comments) 01/15/2011    ROS:  General: Negative for  anorexia, weight loss, fever, chills, fatigue, weakness. ENT: Negative for hoarseness, difficulty swallowing , nasal congestion. CV: Negative for chest pain, angina, palpitations, dyspnea on exertion, peripheral edema.  Respiratory: Negative for dyspnea at rest, dyspnea on exertion, cough, sputum, wheezing.  GI: See history of present illness. GU:  Negative for dysuria, hematuria, urinary incontinence, urinary frequency, nocturnal urination.  Endo: Negative for unusual weight change.    Physical Examination:   BP 129/54  Pulse 61  Temp 98 F (36.7 C) (Temporal)  Ht 5' 4.5" (1.638 m)  Wt 267 lb 9.6 oz (121.383 kg)  BMI 45.22 kg/m2  General: Well-nourished, well-developed in no acute distress.  Eyes: No icterus. Mouth: Oropharyngeal mucosa moist and pink , no lesions erythema or exudate. Lungs: Clear to auscultation bilaterally.  Heart: Regular rate and rhythm, no murmurs rubs or gallops.  Abdomen: Bowel sounds are normal, nontender, nondistended, no hepatosplenomegaly or masses, no abdominal bruits or hernia , no rebound or guarding.   Extremities: No lower extremity edema. No clubbing or deformities. Neuro: Alert and oriented x 4   Skin: Warm and dry, no jaundice.   Psych: Alert and cooperative, normal mood and affect.

## 2011-11-09 NOTE — Patient Instructions (Addendum)
Please call if you decide you want to have your esophagus stretched prior to next spring. Otherwise we will plan to see you back around March of next year and schedule you for both upper endoscopy with dilation and colonoscopy.

## 2011-12-10 ENCOUNTER — Other Ambulatory Visit: Payer: Self-pay | Admitting: Gastroenterology

## 2011-12-11 NOTE — Addendum Note (Signed)
Addended by: Tiffany Kocher on: 12/11/2011 09:47 AM   Modules accepted: Orders

## 2012-04-18 ENCOUNTER — Encounter: Payer: Self-pay | Admitting: *Deleted

## 2012-07-10 ENCOUNTER — Other Ambulatory Visit: Payer: Self-pay | Admitting: Gastroenterology

## 2012-07-26 ENCOUNTER — Encounter: Payer: Self-pay | Admitting: Internal Medicine

## 2012-10-03 ENCOUNTER — Ambulatory Visit: Payer: PRIVATE HEALTH INSURANCE | Admitting: Adult Health

## 2012-10-12 ENCOUNTER — Ambulatory Visit (INDEPENDENT_AMBULATORY_CARE_PROVIDER_SITE_OTHER): Payer: PRIVATE HEALTH INSURANCE | Admitting: Adult Health

## 2012-10-12 ENCOUNTER — Encounter: Payer: Self-pay | Admitting: Adult Health

## 2012-10-12 VITALS — BP 138/78 | HR 60 | Ht 64.0 in | Wt 273.0 lb

## 2012-10-12 DIAGNOSIS — I10 Essential (primary) hypertension: Secondary | ICD-10-CM

## 2012-10-12 DIAGNOSIS — R0609 Other forms of dyspnea: Secondary | ICD-10-CM

## 2012-10-12 DIAGNOSIS — I728 Aneurysm of other specified arteries: Secondary | ICD-10-CM

## 2012-10-12 DIAGNOSIS — E669 Obesity, unspecified: Secondary | ICD-10-CM

## 2012-10-12 NOTE — Assessment & Plan Note (Signed)
She has never been tested  for OSA, with difficult to control hypertension. Currently she is on 3 antihypertensives and is well controlled. Would not put her back on clonidine. I will plan for sleep study to r/o OSA contributing to hypertension.

## 2012-10-12 NOTE — Assessment & Plan Note (Signed)
Obesity and deconditioning may be contributing, but as discussed above, she will be tested for OSA for thorough evaluation.
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
## 2012-10-12 NOTE — Progress Notes (Signed)
HPI: Beverly Young is an obese 62 y/o patient formerly of Dr. Daleen Squibb, we are seeing for ongoing assessment and treatment of hypertension, and chest discomfort. She had normal cardiac cath in 08/2008 with normal LVEF of 55%-60%. She has been on a walking regimen with 12 lbs wt loss in weeks, but has noticed increased dyspnea. Often having to stop and rest while walking. She is on multiple antihypertensive medications per Dr. Janna Arch, most recently clonidine 0.1 mg BID, which she has stopped taking due to chronic fatigue and sleepiness. She is otherwise medically compliant.  She denies chest pain, dizziness, or near syncope. She has occasional palpitations.   Allergies  Allergen Reactions  . Codeine Other (See Comments)    Make her feel crazy    Current Outpatient Prescriptions  Medication Sig Dispense Refill  . amLODipine (NORVASC) 5 MG tablet       . Calcium Carbonate-Vitamin D (CALCIUM-VITAMIN D) 500-200 MG-UNIT per tablet Take 1 tablet by mouth daily.        . Glucosamine Sulfate 1000 MG CAPS Take 1 capsule by mouth daily.        . metoprolol succinate (TOPROL-XL) 50 MG 24 hr tablet       . multivitamin-iron-minerals-folic acid (CENTRUM) chewable tablet Chew 1 tablet by mouth daily.        . naproxen (NAPROSYN) 500 MG tablet       . omeprazole (PRILOSEC) 20 MG capsule Take 1 capsule (20 mg total) by mouth daily before breakfast.  30 capsule  11  . omeprazole (PRILOSEC) 20 MG capsule TAKE 1 CAPSULE (20 MG TOTAL) BY MOUTH DAILY.  30 capsule  5  . Potassium Gluconate 550 MG TABS Take 1 tablet by mouth daily.      . rosuvastatin (CRESTOR) 10 MG tablet Take 10 mg by mouth daily.        . valsartan-hydrochlorothiazide (DIOVAN-HCT) 320-12.5 MG per tablet Take 1 tablet by mouth daily.        No current facility-administered medications for this visit.    Past Medical History  Diagnosis Date  . HTN (hypertension)   . 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 GERD (gastroesophageal reflux disease)     . Peptic stricture of esophagus     last dilated 07/2007  . Hemorrhoids   . Obesity     Past Surgical History  Procedure Laterality Date  . Esophagogastroduodenoscopy  07/11/07    circumferential distal esophageal ersionc/soft peptic ring dilated 81F/small hiatal herina  . Colonoscopy  07/11/07    external hemorrhoidal otherwise normal rectum pancolonic diverticula, suboptimal prep on the right (Next 07/2012)  . Cesarean section    . Partial hysterectomy    . Knee surgery      right    ROS: Review of systems complete and found to be negative unless listed above  PHYSICAL EXAM BP 138/78  Pulse 60  Ht 5\' 4"  (1.626 m)  Wt 273 lb (123.832 kg)  BMI 46.84 kg/m2  General: Well developed, well nourished, in no acute distress Head: Eyes PERRLA, No xanthomas.   Normal cephalic and atramatic  Lungs: Clear bilaterally to auscultation and percussion. Heart: HRRR S1 S2, without MRG.  Pulses are 2+ & equal.            No carotid bruit. No JVD.  No abdominal bruits. No femoral bruits. Abdomen: Bowel sounds are positive, abdomen soft and non-tender without masses or  Hernia's noted. Msk:  Back normal, normal gait. Normal strength and tone for age. Extremities: No clubbing, cyanosis or edema.  DP +1 Neuro: Alert and oriented X 3. Psych:  Good affect, responds appropriately  EKG: NSR rate of 64 bpm.  ASSESSMENT AND PLAN

## 2012-10-12 NOTE — Assessment & Plan Note (Signed)
She has started a healthy lifestyle with daily exercise and low calorie diet, losing 12 lbs in 3 weeks. Breathing has been an issue for her. She stops and rests, but continues to walk up to 1/2 mile every day. I have encouraged her on this change and to continue her efforts.

## 2012-10-12 NOTE — Patient Instructions (Addendum)
Your physician recommends that you schedule a follow-up appointment in: 1 month  Your physician has recommended that you have a sleep study. This test records several body functions during sleep, including: brain activity, eye movement, oxygen and carbon dioxide blood levels, heart rate and rhythm, breathing rate and rhythm, the flow of air through your mouth and nose, snoring, body muscle movements, and chest and belly movement.  Your physician recommends that you continue on your current medications as directed. Please refer to the Current Medication list given to you today.  Your physician recommends that you return for lab work in [employer]

## 2012-10-13 ENCOUNTER — Encounter: Payer: Self-pay | Admitting: *Deleted

## 2012-10-13 LAB — BASIC METABOLIC PANEL
BUN: 32 mg/dL — ABNORMAL HIGH (ref 6–23)
CO2: 26 mEq/L (ref 19–32)
Calcium: 9.7 mg/dL (ref 8.4–10.5)
Creat: 1.07 mg/dL (ref 0.50–1.10)
Glucose, Bld: 122 mg/dL — ABNORMAL HIGH (ref 70–99)

## 2012-10-25 ENCOUNTER — Ambulatory Visit: Payer: PRIVATE HEALTH INSURANCE | Attending: Adult Health | Admitting: Sleep Medicine

## 2012-10-25 ENCOUNTER — Ambulatory Visit: Payer: PRIVATE HEALTH INSURANCE | Admitting: Sleep Medicine

## 2012-10-25 DIAGNOSIS — G4733 Obstructive sleep apnea (adult) (pediatric): Secondary | ICD-10-CM | POA: Insufficient documentation

## 2012-10-25 DIAGNOSIS — R0989 Other specified symptoms and signs involving the circulatory and respiratory systems: Secondary | ICD-10-CM

## 2012-10-25 DIAGNOSIS — I10 Essential (primary) hypertension: Secondary | ICD-10-CM

## 2012-10-25 DIAGNOSIS — I728 Aneurysm of other specified arteries: Secondary | ICD-10-CM

## 2012-10-25 DIAGNOSIS — Z6841 Body Mass Index (BMI) 40.0 and over, adult: Secondary | ICD-10-CM | POA: Insufficient documentation

## 2012-10-25 DIAGNOSIS — R0609 Other forms of dyspnea: Secondary | ICD-10-CM

## 2012-10-29 NOTE — Procedures (Signed)
[facility] Eden Rho A. Gerilyn Pilgrim, MD     [email or web address]        NAMEJANAYLA, MARIK                 ACCOUNT NO.:  [id number]  MEDICAL RECORD NO.:  [id number]          PATIENT TYPE:  OUT  LOCATION:  SLEEP LAB                     FACILITY:  [facility]  PHYSICIAN:  Odin Mariani A. Gerilyn Pilgrim, M.D. DATE OF BIRTH:  22-Sep-1950  DATE OF STUDY:                           NOCTURNAL POLYSOMNOGRAM  REFERRING PHYSICIAN:  Bettey Mare. Lyman Bishop, NP  INDICATIONS:  A 62 year old who presents with obesity, hypersomnia and fatigue.  The study is being done to evaluate for obstructive sleep apnea syndrome.   EPWORTH SLEEPINESS SCORE:  3.  BMI 46.  MEDICATIONS:  Losartan, glucosamine, omeprazole, Norvasc, naproxen, metoprolol, Crestor, calcium, vitamin D multivitamin.  SLEEP ARCHITECTURE:  Thee total recording time is 384 minutes.  Sleep efficiency 70%, sleep latency 15 minutes.  REM latency 243 minutes. Stage N1 13%, N2 50%, N3 32% and the REM sleep 4%.  RESPIRATORY DATA:  Baseline oxygen saturation is 95, lowest saturation 87 during both REM and non-REM sleep.  Diagnostic AHI 34 and RDI also 34.  V-events essentially occurred during the last 1/3rd to 1/2 of the night and therefore titration could not be done.  LIMB MOVEMENT SUMMARY:  PLM index is 11.  ELECTROCARDIOGRAM SUMMARY:  Average heart rate is 66 with no significant dysrhythmias observed.  IMPRESSION: 1. Moderate obstructive sleep apnea syndrome. 2. Mild periodic limb movement disorder sleep.  RECOMMENDATION:  Formal CPAP titration study.  Thanks for this referral.      Evaline Waltman A. Gerilyn Pilgrim, M.D.    KAD/MEDQ  D:  10/29/2012 19:25:47  T:  10/29/2012 19:43:05  Job:  [id number]

## 2012-11-01 ENCOUNTER — Telehealth: Payer: Self-pay | Admitting: *Deleted

## 2012-11-01 NOTE — Telephone Encounter (Signed)
Spoke to pt about sleep study results. Referred pt to Dr Juanetta Gosling office. Information faxed to Dr.Hawkins' office. They will contact patient with appointment.

## 2012-11-11 ENCOUNTER — Ambulatory Visit: Payer: PRIVATE HEALTH INSURANCE | Admitting: Adult Health

## 2012-11-22 ENCOUNTER — Ambulatory Visit (INDEPENDENT_AMBULATORY_CARE_PROVIDER_SITE_OTHER): Payer: PRIVATE HEALTH INSURANCE | Admitting: Adult Health

## 2012-11-22 ENCOUNTER — Encounter: Payer: Self-pay | Admitting: Adult Health

## 2012-11-22 VITALS — BP 113/83 | HR 67 | Ht 64.5 in | Wt 273.8 lb

## 2012-11-22 DIAGNOSIS — E669 Obesity, unspecified: Secondary | ICD-10-CM

## 2012-11-22 DIAGNOSIS — I10 Essential (primary) hypertension: Secondary | ICD-10-CM

## 2012-11-22 NOTE — Patient Instructions (Addendum)
Your physician recommends that you schedule a follow-up appointment in: 6 months  

## 2012-11-22 NOTE — Assessment & Plan Note (Signed)
She is requesting appetite suppression medications. I have explained to her that with hypertension, I would not be in favor of this option concerning wt loss. She is advised to increase her exercise and decrease calories.

## 2012-11-22 NOTE — Assessment & Plan Note (Signed)
She is positive for moderate OSA and has an appt with Dr. Juanetta Gosling on Oct 20th. I will not make any changes in her medication regimen at this time, until she is fitted for CPAP after evaluation by Dr. Juanetta Gosling. I have discussed with her that we will probably need to titrate medications down in order to prevent hypotension when she becomes compliant with CPAP>.

## 2012-11-22 NOTE — Progress Notes (Signed)
HPI: [name] is a 62 year old warmer patient of Dr. wall, we are following for ongoing assessment and management of hypertension. Patient is on multiple antihypertensive medications, and is also followed by Dr. Delbert Young. A sleep study was recommended on last visit to evaluate for obstructive sleep apnea with ongoing difficult to control hypertension. She was completed on 10/25/2012 demonstrated moderate obstructive sleep apnea syndrome. She is being referred to Dr. Juanetta Young for evaluation and need for CPAP and continued management.   She has occasional palpitations, and trying hard to lose weight and exercise. She is requesting diet aid.       Allergies  Allergen Reactions  . Codeine Other (See Comments)    Make her feel crazy    Current Outpatient Prescriptions  Medication Sig Dispense Refill  . amLODipine (NORVASC) 5 MG tablet       . Calcium Carbonate-Vitamin D (CALCIUM-VITAMIN D) 500-200 MG-UNIT per tablet Take 1 tablet by mouth daily.        . Glucosamine Sulfate 1000 MG CAPS Take 1 capsule by mouth daily.        . metoprolol succinate (TOPROL-XL) 50 MG 24 hr tablet       . multivitamin-iron-minerals-folic acid (CENTRUM) chewable tablet Chew 1 tablet by mouth daily.        . naproxen (NAPROSYN) 500 MG tablet       . omeprazole (PRILOSEC) 20 MG capsule Take 1 capsule (20 mg total) by mouth daily before breakfast.  30 capsule  11  . omeprazole (PRILOSEC) 20 MG capsule TAKE 1 CAPSULE (20 MG TOTAL) BY MOUTH DAILY.  30 capsule  5  . Potassium Gluconate 550 MG TABS Take 1 tablet by mouth daily.      . pravastatin (PRAVACHOL) 40 MG tablet       . rosuvastatin (CRESTOR) 10 MG tablet Take 10 mg by mouth daily.        . valsartan-hydrochlorothiazide (DIOVAN-HCT) 320-12.5 MG per tablet Take 1 tablet by mouth daily.        No current facility-administered medications for this visit.    Past Medical History  Diagnosis Date  . HTN (hypertension)   . 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 GERD (gastroesophageal reflux disease)   . Peptic stricture of esophagus     last dilated 07/2007  . Hemorrhoids   . Obesity     Past Surgical History  Procedure Laterality Date  . Esophagogastroduodenoscopy  07/11/07    circumferential distal esophageal ersionc/soft peptic ring dilated 6F/small hiatal herina  . Colonoscopy  07/11/07    external hemorrhoidal otherwise normal rectum pancolonic diverticula, suboptimal prep on the right (Next 07/2012)  . Cesarean section    . Partial hysterectomy    . Knee surgery      right    ROS: Review of systems complete and found to be negative unless listed above  PHYSICAL EXAM Ht 5' 4.5" (1.638 m)  Wt 273 lb 12 oz (124.172 kg)  BMI 46.28 kg/m2  General: Well developed, well nourished, in no acute distress Head: Eyes PERRLA, No xanthomas.   Normal cephalic and atramatic  Lungs: Clear bilaterally to auscultation and percussion. Heart: HRRR S1 S2, without MRG.  Pulses are 2+ & equal.            No carotid bruit. No JVD.  No abdominal bruits. No femoral bruits. Abdomen: Bowel sounds are positive, abdomen soft and non-tender without masses or  Hernia's noted. Msk:  Back normal, normal gait. Normal strength and tone for age. Extremities: No clubbing, cyanosis or edema.  DP +1 Neuro: Alert and oriented X 3. Psych:  Good affect, responds appropriately  :ASSESSMENT AND PLAN
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
## 2013-01-16 ENCOUNTER — Encounter: Payer: Self-pay | Admitting: Adult Health

## 2013-01-16 ENCOUNTER — Ambulatory Visit (INDEPENDENT_AMBULATORY_CARE_PROVIDER_SITE_OTHER): Payer: PRIVATE HEALTH INSURANCE | Admitting: Adult Health

## 2013-01-16 VITALS — BP 150/59 | HR 60 | Ht 64.0 in | Wt 277.0 lb

## 2013-01-16 DIAGNOSIS — K219 Gastro-esophageal reflux disease without esophagitis: Secondary | ICD-10-CM

## 2013-01-16 DIAGNOSIS — Z8679 Personal history of other diseases of the circulatory system: Secondary | ICD-10-CM

## 2013-01-16 DIAGNOSIS — I10 Essential (primary) hypertension: Secondary | ICD-10-CM

## 2013-01-16 MED ORDER — RANITIDINE HCL 300 MG PO TABS: 300.0000 mg | ORAL_TABLET | Freq: Every day | ORAL | Status: DC

## 2013-01-16 MED ORDER — TRIAMTERENE-HCTZ 37.5-25 MG PO TABS: 1.0000 | ORAL_TABLET | Freq: Every day | ORAL | Status: DC
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
## 2013-01-16 NOTE — Patient Instructions (Addendum)
Your physician wants you to follow-up in:  ONE MONTH WITH Lorin Picket NP/ONE WEEK FOR BLOOD PRESSURE CHECK     Your physician has recommended you make the following change in your medication:   1) START RANITIDINE 300MG  ONCE DAILY  2) START MAXZIDE 37.5MG /25MG  ONCE DAILY   Your physician recommends that you return for lab work in: ONE WEEK (SLIPS GIVEN FOR BM[facility]) BETTY RALIFF WILL MEET YOU IN THE LOBBY OF [facility] TODAY AFTER THIS OFFICE VISIT TO DISCUSS APPLYING FOR [facility] DISCOUNT FOR TESTS/LABS  Your physician recommends THAT YOU ESTABLISH CARE WITH DR Karilyn Cota AT [phone]  Your physician has requested that you regularly monitor and record your blood pressure readings at home. Please use the same machine at the same time of day to check your readings and record them to bring to your follow-up visit.BRING WITH YOU ARE YOUR NURSE VISIT

## 2013-01-16 NOTE — Assessment & Plan Note (Signed)
She is requesting medication for chronic GERD. I have started her on ranitidine 300 mg daily as this is also on the [facility] pharmacy list. We will reassess her response to this medication on followup appointment in one month.

## 2013-01-16 NOTE — Progress Notes (Signed)
HPI Mrs. Macdowell is a 62 year old former patient of Dr. Daleen Squibb we are following for ongoing assessment and management of hypertension. The patient is on multiple hypertensive medications and is also followed by primary care Dr. Janna Arch. She was last seen in the office in October of 2014 with complaints of occasional palpitation. At that time blood pressure was controlled. She was found to be positive for moderate OSA and was referred to Dr. Juanetta Gosling for CPAP.   She comes today tearful and confused about medications. She is recently been seen by her primary care physician Dr. Janna Arch who has changed his medications around which she is unable to afford on the [facility] pharmacy. The patient has recently lost insurance associated with her husband, as he has been Adult nurse for Medicaid. She was requesting a review of her medications and changes to [facility] pharmacy. Blood pressure is mildly elevated today. She is also looking for another primary care physician. She states that she was falling a lot and having some shortness of breath one week ago. She took one of her brothers Demadex 50 mg tablets, and felt a lot better with diuresis. She only took one dose.  Allergies  Allergen Reactions  . Codeine Other (See Comments)    Make her feel crazy    Current Outpatient Prescriptions  Medication Sig Dispense Refill  . amLODipine (NORVASC) 5 MG tablet       . Calcium Carbonate-Vitamin D (CALCIUM-VITAMIN D) 500-200 MG-UNIT per tablet Take 1 tablet by mouth daily.        . Glucosamine Sulfate 1000 MG CAPS Take 1 capsule by mouth daily.        . metoprolol succinate (TOPROL-XL) 50 MG 24 hr tablet Take 50 mg by mouth daily.       . multivitamin-iron-minerals-folic acid (CENTRUM) chewable tablet Chew 1 tablet by mouth daily.        . naproxen (NAPROSYN) 500 MG tablet Take 500 mg by mouth daily.       Marland Kitchen omeprazole (PRILOSEC) 20 MG capsule Take 1 capsule (20 mg total) by mouth daily before breakfast.  30 capsule   11  . omeprazole (PRILOSEC) 20 MG capsule TAKE 1 CAPSULE (20 MG TOTAL) BY MOUTH DAILY.  30 capsule  5  . Potassium Gluconate 550 MG TABS Take 1 tablet by mouth daily.      . pravastatin (PRAVACHOL) 40 MG tablet Take 40 mg by mouth daily.       . valsartan-hydrochlorothiazide (DIOVAN-HCT) 320-12.5 MG per tablet Take 1 tablet by mouth daily.        No current facility-administered medications for this visit.    Past Medical History  Diagnosis Date  . HTN (hypertension)   . 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 GERD (gastroesophageal reflux disease)   . Peptic stricture of esophagus     last dilated 07/2007  . Hemorrhoids   . Obesity     Past Surgical History  Procedure Laterality Date  . Esophagogastroduodenoscopy  07/11/07    circumferential distal esophageal ersionc/soft peptic ring dilated 49F/small hiatal herina  . Colonoscopy  07/11/07    external hemorrhoidal otherwise normal rectum pancolonic diverticula, suboptimal prep on the right (Next 07/2012)  . Cesarean section    . Partial hysterectomy    . Knee surgery      right    ZOX:WRUEAV of systems complete and found to be negative unless listed above  PHYSICAL EXAM General: Well developed, well nourished, in no acute distress, obese.  Head: Eyes PERRLA, No xanthomas.   Normal cephalic and atramatic  Lungs: Clear bilaterally to auscultation and percussion. Heart: HRRR S1 S2, without MRG.  Pulses are 2+ & equal.            No carotid bruit. No JVD.  No abdominal bruits. No femoral bruits. Abdomen: Bowel sounds are positive, abdomen soft and non-tender without masses or                  Hernia's noted. Msk:  Back normal, normal gait. Normal strength and tone for age. Extremities: No clubbing, cyanosis or edema.  DP +1 Neuro: Alert and oriented X 3. Psych:  Tearful affect, responds appropriately     ASSESSMENT AND PLAN

## 2013-01-16 NOTE — Assessment & Plan Note (Signed)
I reviewed her medication list carefully, [facility] does not provide [facility]. She is hypertensive on this visit. We will start her on triamterene HCTZ 37.5/25 mg daily. She will continue other medications listed as her [facility] pharmacy list does carry the majority of her medicines. I want to followup with the BMET in one week with addition of medication to evaluate kidney function and potassium status. She also has been given a blood pressure recording sheet. She has parameters the record her blood pressure on a daily basis at the same time daily. Would like to see her blood pressure between 130 to 140 systolic, between 50 and 65 diastolic. She is also meeting with Lubertha Basque, she is our Pensions consultant for low-income patients, to assist with labs which are due in one week.

## 2013-01-16 NOTE — Assessment & Plan Note (Signed)
She does not appear to be in fluid overload at this time. Weight is up 4 pounds since being seen last in October. I will continue to monitor this. She is advised to call for any worsening of breathing, lower shin edema or significant chest pressure.

## 2013-01-24 ENCOUNTER — Telehealth: Payer: Self-pay | Admitting: Adult Health

## 2013-01-24 NOTE — Telephone Encounter (Signed)
FYI PT REPORT BELOW

## 2013-01-24 NOTE — Telephone Encounter (Signed)
Patient called to give BP reading due to not having insurance to cover BP check.  Her BP was 123/64. / tgs

## 2013-01-26 NOTE — Telephone Encounter (Signed)
Reviewed

## 2013-01-26 NOTE — Telephone Encounter (Signed)
Pt notified of comment for K.Lyman Bishop NP

## 2013-02-11 LAB — BASIC METABOLIC PANEL
BUN: 30 mg/dL — ABNORMAL HIGH (ref 6–23)
CO2: 28 mEq/L (ref 19–32)
Calcium: 9.3 mg/dL (ref 8.4–10.5)
Chloride: 105 mEq/L (ref 96–112)
Creat: 1.21 mg/dL — ABNORMAL HIGH (ref 0.50–1.10)
Glucose, Bld: 101 mg/dL — ABNORMAL HIGH (ref 70–99)
Potassium: 4.6 mEq/L (ref 3.5–5.3)
Sodium: 141 mEq/L (ref 135–145)

## 2013-02-14 ENCOUNTER — Encounter: Payer: Self-pay | Admitting: Nurse Practitioner

## 2013-02-14 ENCOUNTER — Encounter: Payer: Self-pay | Admitting: Adult Health

## 2013-02-14 ENCOUNTER — Ambulatory Visit (INDEPENDENT_AMBULATORY_CARE_PROVIDER_SITE_OTHER): Payer: BC Managed Care – PPO | Admitting: Adult Health

## 2013-02-14 VITALS — BP 134/52 | HR 57 | Ht 64.5 in | Wt 272.0 lb

## 2013-02-14 DIAGNOSIS — E669 Obesity, unspecified: Secondary | ICD-10-CM

## 2013-02-14 DIAGNOSIS — I10 Essential (primary) hypertension: Secondary | ICD-10-CM

## 2013-02-14 MED ORDER — OMEPRAZOLE 20 MG PO CPDR: 20.0000 mg | DELAYED_RELEASE_CAPSULE | Freq: Every day | ORAL | Status: DC

## 2013-02-14 NOTE — Progress Notes (Signed)
HPI: Beverly Young is a 63 year old former patient of Dr. Verl Blalock, we are following for ongoing assessment and management of hypertension. She is on polypharmacy concerning her antihypertensive medications and is also followed by Dr. Lorriane Shire for this. She was last seen in the office on 01/16/2013 very confused and tearful about her medications as she had been on so many. Vacations were adjusted and her prescriptions for transfer to [place], as the patient has no insurance at this time. She was started on Travatan HCTZ 37.5/25 mg daily and was to continue her other medications. She was sent home with a blood pressure recording sheet. She was given parameters for blood pressure levels. She was also started on Ranatidine 300 mg for complaints of GERD symptoms. She is here for followup.   She is feeling well and has been compliant with all medication and BP measurements. She brings the record with her. BP control is excellent and she is feeling better.  Allergies  Allergen Reactions  . Codeine Other (See Comments)    Make her feel crazy    Current Outpatient Prescriptions  Medication Sig Dispense Refill  . atenolol (TENORMIN) 50 MG tablet Take 50 mg by mouth daily.      . benazepril (LOTENSIN) 40 MG tablet Take 40 mg by mouth daily.      . Calcium Carbonate-Vitamin D (CALCIUM-VITAMIN D) 500-200 MG-UNIT per tablet Take 1 tablet by mouth daily.        . Glucosamine Sulfate 1000 MG CAPS Take 1 capsule by mouth daily.        Marland Kitchen lovastatin (MEVACOR) 20 MG tablet Take 20 mg by mouth at bedtime.      . multivitamin-iron-minerals-folic acid (CENTRUM) chewable tablet Chew 1 tablet by mouth daily.        . Potassium Gluconate 550 MG TABS Take 1 tablet by mouth daily.      . ranitidine (ZANTAC) 300 MG tablet Take 1 tablet (300 mg total) by mouth at bedtime.  30 tablet  6  . triamterene-hydrochlorothiazide (MAXZIDE-25) 37.5-25 MG per tablet Take 1 tablet by mouth daily.  30 tablet  6  . omeprazole  (PRILOSEC) 20 MG capsule TAKE 1 CAPSULE (20 MG TOTAL) BY MOUTH DAILY.  30 capsule  5   No current facility-administered medications for this visit.    Past Medical History  Diagnosis Date  . HTN (hypertension)   . 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 GERD (gastroesophageal reflux disease)   . Peptic stricture of esophagus     last dilated 07/2007  . Hemorrhoids   . Obesity     Past Surgical History  Procedure Laterality Date  . Esophagogastroduodenoscopy  07/11/07    circumferential distal esophageal ersionc/soft peptic ring dilated 64F/small hiatal herina  . Colonoscopy  07/11/07    external hemorrhoidal otherwise normal rectum pancolonic diverticula, suboptimal prep on the right (Next 07/2012)  . Cesarean section    . Partial hysterectomy    . Knee surgery      right    JQB:HALPFX of systems complete and found to be negative unless listed above  PHYSICAL EXAM BP 134/52  Pulse 57  Ht 5' 4.5" (1.638 m)  Wt 272 lb (123.378 kg)  BMI 45.98 kg/m2 General: Well developed, well nourished, in no acute distress Head: Eyes PERRLA, No xanthomas.   Normal cephalic and atramatic  Lungs: Clear bilaterally to auscultation and percussion. Heart: HRRR S1 S2, without MRG.  Pulses are 2+ & equal.  No carotid bruit. No JVD.  No abdominal bruits. No femoral bruits. Abdomen: Bowel sounds are positive, abdomen soft and non-tender without masses or                  Hernia's noted. Msk:  Back normal, normal gait. Normal strength and tone for age. Extremities: No clubbing, cyanosis or edema.  DP +1 Neuro: Alert and oriented X 3. Psych:  Good affect, responds appropriately    ASSESSMENT AND PLAN

## 2013-02-14 NOTE — Assessment & Plan Note (Signed)
Blood pressure is very well controlled now on her current medications. I have asked her to take NSAID every other day instead of daily. She states that she needs this for help with right hip pain. She agrees to take it as directed. Will continue meds and see her in 3 months.
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
## 2013-02-14 NOTE — Patient Instructions (Addendum)
Your physician recommends that you schedule a follow-up appointment in 3 MONTHS. 

## 2013-02-14 NOTE — Assessment & Plan Note (Signed)
She will increase her exercise as much as possible. She actually lost wt over holidays. I have encouraged her to eat healthy.

## 2013-06-01 ENCOUNTER — Ambulatory Visit: Payer: BC Managed Care – PPO | Admitting: Adult Health

## 2013-07-07 ENCOUNTER — Ambulatory Visit: Payer: BC Managed Care – PPO | Admitting: Adult Health

## 2013-07-13 NOTE — Progress Notes (Signed)
HPI: [name] is a 63 year old patient to be est. with Dr. Pennelope Young or Dr. Harl Young were following for ongoing assessment and management of hypertension. Other history includes morbid obesity, and GERD. She was last in the office in January 2015. At that time she was confused concerning her medicines was compliant and did bring her blood pressure record with her. At that time her blood pressure was well-controlled at 134/52. We advised her to stop taking NSAID  daily and changed to when necessary or every other day to avoid kidney issues and hypertension.  She comes today without any complaints. She is medically compliant. She denies any dizziness chest pain palpitations. She is watching her salt intake. Weight is up approximately 5 pounds. She states she works outside in her yard a lot and has had no decrease in energy.    Allergies  Allergen Reactions  . Codeine Other (See Comments)    Make her feel crazy    Current Outpatient Prescriptions  Medication Sig Dispense Refill  . amLODipine (NORVASC) 5 MG tablet       . buPROPion (WELLBUTRIN XL) 150 MG 24 hr tablet Take 150 mg by mouth daily.       . Calcium Carbonate-Vitamin D (CALCIUM-VITAMIN D) 500-200 MG-UNIT per tablet Take 1 tablet by mouth daily.        . Glucosamine Sulfate 1000 MG CAPS Take 1 capsule by mouth daily.        Marland Kitchen lovastatin (MEVACOR) 20 MG tablet Take 20 mg by mouth at bedtime.      . metoprolol (LOPRESSOR) 50 MG tablet Take 50 mg by mouth daily.       . multivitamin-iron-minerals-folic acid (CENTRUM) chewable tablet Chew 1 tablet by mouth daily.        . naproxen (NAPROSYN) 500 MG tablet Take 500 mg by mouth every other day.       Marland Kitchen omeprazole (PRILOSEC) 20 MG capsule Take 1 capsule (20 mg total) by mouth daily.  30 capsule  3  . Potassium Gluconate 550 MG TABS Take 1 tablet by mouth daily.      . valsartan-hydrochlorothiazide (DIOVAN-HCT) 320-12.5 MG per tablet       . atenolol (TENORMIN) 50 MG tablet Take 50 mg  by mouth daily.      . benazepril (LOTENSIN) 40 MG tablet Take 40 mg by mouth daily.      . ranitidine (ZANTAC) 300 MG tablet Take 1 tablet (300 mg total) by mouth at bedtime.  30 tablet  6   No current facility-administered medications for this visit.    Past Medical History  Diagnosis Date  . HTN (hypertension)   . 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 GERD (gastroesophageal reflux disease)   . Peptic stricture of esophagus     last dilated 07/2007  . Hemorrhoids   . Obesity     Past Surgical History  Procedure Laterality Date  . Esophagogastroduodenoscopy  07/11/07    circumferential distal esophageal ersionc/soft peptic ring dilated 38F/small hiatal herina  . Colonoscopy  07/11/07    external hemorrhoidal otherwise normal rectum pancolonic diverticula, suboptimal prep on the right (Next 07/2012)  . Cesarean section    . Partial hysterectomy    . Knee surgery      right    ROS: Review of systems complete and found to be negative unless listed above  PHYSICAL EXAM BP 112/68  Pulse 68  Ht 5\' 4"  (1.626 m)  Wt 278 lb (126.1 kg)  BMI 47.70 kg/m2  SpO2 98% General: Well developed, well nourished, in no acute distress Head: Eyes PERRLA, No xanthomas.   Normal cephalic and atramatic  Lungs: Clear bilaterally to auscultation and percussion. Heart: HRRR S1 S2, without MRG.  Pulses are 2+ & equal.            No carotid bruit. No JVD.  No abdominal bruits. No femoral bruits. Abdomen: Bowel sounds are positive, abdomen soft and non-tender without masses or                  Hernia's noted. Msk:  Back normal, normal gait. Normal strength and tone for age. Extremities: No clubbing, cyanosis or edema.  DP +1 Neuro: Alert and oriented X 3. Psych:  Good affect, responds appropriately   ASSESSMENT AND PLAN

## 2013-07-14 ENCOUNTER — Ambulatory Visit (INDEPENDENT_AMBULATORY_CARE_PROVIDER_SITE_OTHER): Payer: BC Managed Care – PPO | Admitting: Adult Health

## 2013-07-14 ENCOUNTER — Encounter: Payer: Self-pay | Admitting: Adult Health

## 2013-07-14 VITALS — BP 112/68 | HR 68 | Ht 64.0 in | Wt 278.0 lb

## 2013-07-14 DIAGNOSIS — I10 Essential (primary) hypertension: Secondary | ICD-10-CM

## 2013-07-14 DIAGNOSIS — E785 Hyperlipidemia, unspecified: Secondary | ICD-10-CM

## 2013-07-14 DIAGNOSIS — R002 Palpitations: Secondary | ICD-10-CM

## 2013-07-14 NOTE — Assessment & Plan Note (Signed)
Followed by Dr. Nevada Crane with labs per his schedule. She has changed her primary care from Dr. Lorriane Shire to Dr. Nevada Crane. This will need to be changed in the computer.

## 2013-07-14 NOTE — Assessment & Plan Note (Signed)
Excellent control blood pressure. She is medically compliant. She is watching her salt intake. All not make any changes at this time. Would like to see her lose some weight which will help with overall healthy status and also help to decrease some of her blood pressure medication dependence. She verbalizes understanding.

## 2013-07-14 NOTE — Patient Instructions (Signed)
Your physician wants you to follow-up in: 9 months with Dr.Koneswaran You will receive a reminder letter in the mail two months in advance. If you don't receive a letter, please call our office to schedule the follow-up appointment.    Your physician recommends that you continue on your current medications as directed. Please refer to the Current Medication list given to you today.      Thank you for choosing [facility] !

## 2013-07-14 NOTE — Assessment & Plan Note (Signed)
Resolved. No changes in medication regimen at this time.
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
## 2013-08-18 ENCOUNTER — Other Ambulatory Visit (HOSPITAL_COMMUNITY): Payer: Self-pay | Admitting: Internal Medicine

## 2013-08-18 DIAGNOSIS — R1011 Right upper quadrant pain: Secondary | ICD-10-CM

## 2013-08-22 ENCOUNTER — Ambulatory Visit (HOSPITAL_COMMUNITY)
Admission: RE | Admit: 2013-08-22 | Discharge: 2013-08-22 | Disposition: A | Discharge status: Home / Self Care | Payer: BC Managed Care – PPO | Source: Ambulatory Visit | Attending: Internal Medicine | Admitting: Internal Medicine

## 2013-08-22 DIAGNOSIS — R1011 Right upper quadrant pain: Secondary | ICD-10-CM | POA: Insufficient documentation

## 2013-08-22 DIAGNOSIS — R160 Hepatomegaly, not elsewhere classified: Secondary | ICD-10-CM | POA: Insufficient documentation

## 2013-08-22 DIAGNOSIS — K7689 Other specified diseases of liver: Secondary | ICD-10-CM | POA: Insufficient documentation

## 2013-12-04 NOTE — Progress Notes (Signed)
HPI: Beverly Young is a 63 year old female patient to be established with Dr. Bronson Ing or Dr.Branch that we are following for ongoing assessment and management of hypertension, with known history of morbid obesity, and GERD. She was last seen in the office in June of 2015 was medically compliant and was without complaint.  She was seen by her primary care physician's office on 11/30/2013 with complaints of palpitations, along with chest discomfort. We are asked to see her concerning her recurrent symptoms.   Records from the office visit included labs, which included a sodium of 142, potassium of 4.5, chloride 103, CO2 of 29, creatinine 1.05. Hemoglobin of 13.5. Total cholesterol 152, HDL 48, hemoglobin A1c 6.2  She states [place], and is unable to walk a 16th of a mile before having significant chest pressure, "feels like her chest with bust."  Stopping and resting will relieve. She is also noticing increased incidences of palpitations, usually at night, or at rest. She denies diaphoresis, radiation or weakness associated with chest pressure or palpitations.   Allergies  Allergen Reactions  . Codeine Other (See Comments)    Make her feel crazy    Current Outpatient Prescriptions  Medication Sig Dispense Refill  . Calcium Carbonate-Vitamin D (CALCIUM-VITAMIN D) 500-200 MG-UNIT per tablet Take 1 tablet by mouth daily.        . Glucosamine Sulfate 1000 MG CAPS Take 1 capsule by mouth daily.        Marland Kitchen lovastatin (MEVACOR) 20 MG tablet Take 20 mg by mouth at bedtime.      . metoprolol (LOPRESSOR) 50 MG tablet Take 50 mg by mouth daily.       . multivitamin-iron-minerals-folic acid (CENTRUM) chewable tablet Chew 1 tablet by mouth daily.        . naproxen (NAPROSYN) 500 MG tablet Take 500 mg by mouth every other day.       Marland Kitchen omeprazole (PRILOSEC) 20 MG capsule Take 1 capsule (20 mg total) by mouth daily.  30 capsule  3  . Potassium Gluconate 550 MG TABS Take 1 tablet  by mouth daily.      . valsartan-hydrochlorothiazide (DIOVAN-HCT) 320-12.5 MG per tablet       . amLODipine (NORVASC) 5 MG tablet Take 5 mg by mouth daily.        No current facility-administered medications for this visit.    Past Medical History  Diagnosis Date  . HTN (hypertension)   . 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 GERD (gastroesophageal reflux disease)   . Peptic stricture of esophagus     last dilated 07/2007  . Hemorrhoids   . Obesity     Past Surgical History  Procedure Laterality Date  . Esophagogastroduodenoscopy  07/11/07    circumferential distal esophageal ersionc/soft peptic ring dilated 19F/small hiatal herina  . Colonoscopy  07/11/07    external hemorrhoidal otherwise normal rectum pancolonic diverticula, suboptimal prep on the right (Next 07/2012)  . Cesarean section    . Partial hysterectomy    . Knee surgery      right    ROS: Review of systems complete and found to be negative unless listed above  PHYSICAL EXAM BP 134/84  Pulse 67  Ht 5\' 4"  (1.626 m)  Wt 280 lb (127.007 kg)  BMI 48.04 kg/m2 General: Well developed, well nourished, in no acute distress, obese. Head: Eyes PERRLA, No xanthomas.   Normal cephalic and atramatic  Lungs: Clear bilaterally to auscultation  and percussion. Heart: HRRR S1 S2, without MRG.  Pulses are 2+ & equal.            No carotid bruit. No JVD.  No abdominal bruits. No femoral bruits. Abdomen: Bowel sounds are positive, abdomen soft and non-tender without masses or                  Hernia's noted. Msk:  Back normal, normal gait. Normal strength and tone for age. Extremities: No clubbing, cyanosis or edema.  DP +1 Neuro: Alert and oriented X 3. Psych:  Good affect, responds appropriately   EKG: NSR with rate of 66 bpm.   ASSESSMENT AND PLAN

## 2013-12-05 ENCOUNTER — Encounter: Payer: Self-pay | Admitting: *Deleted

## 2013-12-05 ENCOUNTER — Ambulatory Visit (INDEPENDENT_AMBULATORY_CARE_PROVIDER_SITE_OTHER): Payer: BC Managed Care – PPO | Admitting: Adult Health

## 2013-12-05 ENCOUNTER — Encounter: Payer: Self-pay | Admitting: Adult Health

## 2013-12-05 VITALS — BP 134/84 | HR 67 | Ht 64.0 in | Wt 280.0 lb

## 2013-12-05 DIAGNOSIS — R071 Chest pain on breathing: Secondary | ICD-10-CM

## 2013-12-05 DIAGNOSIS — R002 Palpitations: Secondary | ICD-10-CM

## 2013-12-05 DIAGNOSIS — R079 Chest pain, unspecified: Secondary | ICD-10-CM

## 2013-12-05 NOTE — Assessment & Plan Note (Signed)
This has worsened since January of this year with associated chest pressure with mild exertion, i.e. Walking less than a block. She has obesity, and states that she has been tested for OSA.  I have reviewed test, completed in Oct of 2014, which determined moderate OSA, with formal CPAP titration recommended. She is not now on CPAP therapy.

## 2013-12-05 NOTE — Assessment & Plan Note (Signed)
She is on metoprolol currently but having worsening symptoms. I will have echocardiogram completed for structural heart disease, LV fx  and pressures.

## 2013-12-05 NOTE — Assessment & Plan Note (Signed)
Will plan 2 day exercise NM study due to large body habitus for evaluation of ischemic causing symptoms. Last stress test was in 07/2006 and found to be negative. Will have her come back in a week or so to discuss test results. If negative, will refer to Dr. Luan Pulling for evaluation for lung disease and possible PFTs and or institution of CPAP bases upon sleep study one year ago.
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
## 2013-12-05 NOTE — Patient Instructions (Signed)
Your physician recommends that you schedule a follow-up appointment in: [place] with Jory Sims  Your physician has requested that you have an echocardiogram. Echocardiography is a painless test that uses sound waves to create images of your heart. It provides your doctor with information about the size and shape of your heart and how well your heart's chambers and valves are working. This procedure takes approximately one hour. There are no restrictions for this procedure.  Your physician has requested that you have a lexiscan myoview. For further information please visit [email or web address]. Please follow instruction sheet, as given. 2 DAY TEST  PLEASE HOLD METOPROLOL THE DAY BEFORE NUCLEAR TEST  PLEASE TAKE ALL OTHER MEDICATIONS AS DIRECTED  Thank you for choosing [facility]!!

## 2013-12-07 ENCOUNTER — Ambulatory Visit (HOSPITAL_COMMUNITY)
Admission: RE | Admit: 2013-12-07 | Discharge: 2013-12-07 | Disposition: A | Discharge status: Home / Self Care | Payer: BC Managed Care – PPO | Source: Ambulatory Visit | Attending: Adult Health | Admitting: Adult Health

## 2013-12-07 ENCOUNTER — Encounter (HOSPITAL_COMMUNITY): Payer: BC Managed Care – PPO

## 2013-12-07 ENCOUNTER — Encounter (HOSPITAL_COMMUNITY): Payer: Self-pay

## 2013-12-07 ENCOUNTER — Encounter (HOSPITAL_COMMUNITY)
Admission: RE | Admit: 2013-12-07 | Discharge: 2013-12-07 | Disposition: A | Discharge status: Home / Self Care | Payer: BC Managed Care – PPO | Source: Ambulatory Visit | Attending: Adult Health | Admitting: Adult Health

## 2013-12-07 DIAGNOSIS — R079 Chest pain, unspecified: Secondary | ICD-10-CM

## 2013-12-07 DIAGNOSIS — R002 Palpitations: Secondary | ICD-10-CM | POA: Insufficient documentation

## 2013-12-07 DIAGNOSIS — I10 Essential (primary) hypertension: Secondary | ICD-10-CM | POA: Diagnosis not present

## 2013-12-07 DIAGNOSIS — I517 Cardiomegaly: Secondary | ICD-10-CM

## 2013-12-07 MED ORDER — SODIUM CHLORIDE 0.9 % IJ SOLN
INTRAMUSCULAR | Status: AC
  Administered 2013-12-07: 10 mL via INTRAVENOUS
  Filled 2013-12-07: qty 10

## 2013-12-07 MED ORDER — REGADENOSON 0.4 MG/5ML IV SOLN
0.4000 mg | Freq: Once | INTRAVENOUS | Status: AC | PRN
  Administered 2013-12-07: 0.4 mg via INTRAVENOUS

## 2013-12-07 MED ORDER — TECHNETIUM TC 99M SESTAMIBI GENERIC - CARDIOLITE
30.0000 | Freq: Once | INTRAVENOUS | Status: AC | PRN
  Administered 2013-12-07: 30 via INTRAVENOUS

## 2013-12-07 MED ORDER — REGADENOSON 0.4 MG/5ML IV SOLN
INTRAVENOUS | Status: AC
  Administered 2013-12-07: 0.4 mg via INTRAVENOUS
  Filled 2013-12-07: qty 5

## 2013-12-07 MED ORDER — SODIUM CHLORIDE 0.9 % IJ SOLN
10.0000 mL | INTRAMUSCULAR | Status: DC | PRN
  Administered 2013-12-07: 10 mL via INTRAVENOUS

## 2013-12-07 NOTE — Progress Notes (Signed)
*  PRELIMINARY RESULTS* Echocardiogram 2D Echocardiogram has been performed.  [name] 12/07/2013, 11:47 AM

## 2013-12-07 NOTE — Progress Notes (Signed)
Stress Lab Nurses Notes - [facility] 12/07/2013 Reason for doing test: Chest Pain and Palpitation Type of test: unable to reach THR while on TM, changed test Lexiscan given. Nurse performing test: Gerrit Halls, RN Nuclear Medicine Tech: Melburn Hake Echo Tech: Not Applicable MD performing test: Jefm Bryant MD: [facility] explained and consent signed: Yes.   IV started: 22g jelco, Saline lock flushed and No redness or edema Symptoms:SOB & Dizziness  Treatment/Intervention: None Reason test stopped: protocol completed After recovery IV was: Discontinued via X-ray tech and No redness or edema Patient to return to Nuc. Med at : 11:15 Patient discharged: Home Patient's Condition upon discharge was: stable Comments: During test peak BP 193/62 & HR 127.  Recovery BP 141/65 & HR 81. Symptoms resolved in recovery.  Geanie Cooley T

## 2013-12-08 ENCOUNTER — Encounter (HOSPITAL_COMMUNITY): Payer: Self-pay

## 2013-12-08 ENCOUNTER — Encounter (HOSPITAL_COMMUNITY)
Admission: RE | Admit: 2013-12-08 | Discharge: 2013-12-08 | Disposition: A | Discharge status: Home / Self Care | Payer: BC Managed Care – PPO | Source: Ambulatory Visit | Attending: Adult Health | Admitting: Adult Health

## 2013-12-08 DIAGNOSIS — R079 Chest pain, unspecified: Secondary | ICD-10-CM | POA: Diagnosis not present

## 2013-12-08 MED ORDER — TECHNETIUM TC 99M SESTAMIBI GENERIC - CARDIOLITE
25.0000 | Freq: Once | INTRAVENOUS | Status: AC | PRN
  Administered 2013-12-08: 25 via INTRAVENOUS

## 2013-12-11 NOTE — Progress Notes (Signed)
HPI: Beverly Young is a 63 y/o  female patient to be established with Dr. Bronson Ing or Dr.Branch that we follow for ongoing assessment and management of hypertension, with known history of morbid obesity, and GERD. She was last seen in the eye 6 2015 with complaints of shortness of breath. walking less than 50 feet with associated chest pressure An echocardiogram, and a LexiScan stress test (2-day)planned on last office visit. She is here to iscuss the results and symptoms.  Stress test completed revealed low risk study without evidence of reversible ischemia.echocardiogram completed on 12/07/2013 demonstrated normal LV systolic function and with an EF of 60-65% with increased. Wall thickness in a pattern of mild LVH. The patient had grade 1 diastolic dysfunction.  She continues to have significant DOE with mild chest pressure.  Allergies  Allergen Reactions  . Codeine Other (See Comments)    Make her feel crazy    Current Outpatient Prescriptions  Medication Sig Dispense Refill  . amLODipine (NORVASC) 5 MG tablet Take 5 mg by mouth daily.     . Calcium Carbonate-Vitamin D (CALCIUM-VITAMIN D) 500-200 MG-UNIT per tablet Take 1 tablet by mouth daily.      . Glucosamine Sulfate 1000 MG CAPS Take 1 capsule by mouth daily.      Marland Kitchen lovastatin (MEVACOR) 20 MG tablet Take 20 mg by mouth at bedtime.    . metoprolol (LOPRESSOR) 50 MG tablet Take 50 mg by mouth daily.     . multivitamin-iron-minerals-folic acid (CENTRUM) chewable tablet Chew 1 tablet by mouth daily.      . naproxen (NAPROSYN) 500 MG tablet Take 500 mg by mouth every other day.     Marland Kitchen omeprazole (PRILOSEC) 20 MG capsule Take 1 capsule (20 mg total) by mouth daily. 30 capsule 3  . Potassium Gluconate 550 MG TABS Take 1 tablet by mouth daily.    . valsartan-hydrochlorothiazide (DIOVAN-HCT) 320-12.5 MG per tablet      No current facility-administered medications for this visit.    Past Medical History  Diagnosis Date  . HTN  (hypertension)   . 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 GERD (gastroesophageal reflux disease)   . Peptic stricture of esophagus     last dilated 07/2007  . Hemorrhoids   . Obesity     Past Surgical History  Procedure Laterality Date  . Esophagogastroduodenoscopy  07/11/07    circumferential distal esophageal ersionc/soft peptic ring dilated 87F/small hiatal herina  . Colonoscopy  07/11/07    external hemorrhoidal otherwise normal rectum pancolonic diverticula, suboptimal prep on the right (Next 07/2012)  . Cesarean section    . Partial hysterectomy    . Knee surgery      right    ROS: Review of systems complete and found to be negative unless listed above   PHYSICAL EXAM BP 144/84 mmHg  Pulse 63  Ht 5\' 4"  (1.626 m)  Wt 282 lb (127.914 kg)  BMI 48.38 kg/m2 General: Well developed, well nourished, in no acute distress Head: Eyes PERRLA, No xanthomas.   Normal cephalic and atramatic  Lungs: Clear bilaterally to auscultation and percussion. Heart: HRRR S1 S2, without MRG.  Pulses are 2+ & equal.            No carotid bruit. No JVD.  No abdominal bruits. No femoral bruits. Abdomen: Bowel sounds are positive, abdomen soft and non-tender without masses or                  Hernia's noted.  Msk:  Back normal, normal gait. Normal strength and tone for age. Extremities: No clubbing, cyanosis or edema.  DP +1 Neuro: Alert and oriented X 3. Psych:  Good affect, responds appropriately   ASSESSMENT AND PLAN

## 2013-12-12 ENCOUNTER — Encounter: Payer: Self-pay | Admitting: Adult Health

## 2013-12-12 ENCOUNTER — Ambulatory Visit (INDEPENDENT_AMBULATORY_CARE_PROVIDER_SITE_OTHER): Payer: BC Managed Care – PPO | Admitting: Adult Health

## 2013-12-12 VITALS — BP 144/84 | HR 63 | Ht 64.0 in | Wt 282.0 lb

## 2013-12-12 DIAGNOSIS — R071 Chest pain on breathing: Secondary | ICD-10-CM

## 2013-12-12 DIAGNOSIS — R0609 Other forms of dyspnea: Secondary | ICD-10-CM

## 2013-12-12 DIAGNOSIS — I10 Essential (primary) hypertension: Secondary | ICD-10-CM

## 2013-12-12 DIAGNOSIS — E669 Obesity, unspecified: Secondary | ICD-10-CM

## 2013-12-12 NOTE — Patient Instructions (Signed)
Your physician wants you to follow-up in: 6 months You will receive a reminder letter in the mail two months in advance. If you don't receive a letter, please call our office to schedule the follow-up appointment.     Your physician recommends that you continue on your current medications as directed. Please refer to the Current Medication list given to you today.     Your physician has recommended that you have a pulmonary function test. Pulmonary Function Tests are a group of tests that measure how well air moves in and out of your lungs.      Thank you for choosing [facility] !
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
## 2013-12-12 NOTE — Assessment & Plan Note (Signed)
BMI >30. She is interested in bariatric surgery. She is to be referred to Bariatric physician at the discretion of Dr. Nevada Crane.

## 2013-12-12 NOTE — Assessment & Plan Note (Signed)
She is mildly elevated today but is concerned about her test results. She will continue current medication regimen for now, but titrate up meds if BP continues above 251 systolic.Recommend OSA test at PCP discretion if clinically warranted.

## 2013-12-12 NOTE — Assessment & Plan Note (Signed)
She has had a normal stress cardiolite with essentially normal echo. She is a low risk for cardiac event. I have ordered PFTs with results to Dr. Nevada Crane. She is advised that she should follow up with Dr. Nevada Crane to discuss results. If symptoms persist with normal lung studies, can consider pursing cardiac cath for definitive evaluation of coronaries. Will see her in 6 months.

## 2014-01-03 ENCOUNTER — Encounter (HOSPITAL_COMMUNITY): Payer: BC Managed Care – PPO

## 2014-01-19 ENCOUNTER — Encounter (HOSPITAL_COMMUNITY): Payer: BC Managed Care – PPO

## 2014-06-18 ENCOUNTER — Ambulatory Visit: Payer: Self-pay | Admitting: Adult Health

## 2015-05-03 DIAGNOSIS — R7301 Impaired fasting glucose: Secondary | ICD-10-CM | POA: Diagnosis not present

## 2015-05-03 DIAGNOSIS — I10 Essential (primary) hypertension: Secondary | ICD-10-CM | POA: Diagnosis not present

## 2015-05-10 DIAGNOSIS — R7301 Impaired fasting glucose: Secondary | ICD-10-CM | POA: Diagnosis not present

## 2015-05-10 DIAGNOSIS — E782 Mixed hyperlipidemia: Secondary | ICD-10-CM | POA: Diagnosis not present

## 2015-05-10 DIAGNOSIS — I10 Essential (primary) hypertension: Secondary | ICD-10-CM | POA: Diagnosis not present

## 2015-05-10 DIAGNOSIS — J309 Allergic rhinitis, unspecified: Secondary | ICD-10-CM | POA: Diagnosis not present

## 2015-05-31 ENCOUNTER — Encounter: Payer: PPO | Admitting: Adult Health

## 2015-05-31 NOTE — Progress Notes (Signed)
    ERROR Cancelled  

## 2015-08-01 DIAGNOSIS — L258 Unspecified contact dermatitis due to other agents: Secondary | ICD-10-CM | POA: Diagnosis not present

## 2015-08-01 DIAGNOSIS — M79662 Pain in left lower leg: Secondary | ICD-10-CM | POA: Diagnosis not present

## 2015-08-30 DIAGNOSIS — M79605 Pain in left leg: Secondary | ICD-10-CM | POA: Diagnosis not present

## 2015-09-23 ENCOUNTER — Encounter (HOSPITAL_COMMUNITY): Payer: Self-pay | Admitting: Physical Therapy

## 2015-09-23 ENCOUNTER — Ambulatory Visit (HOSPITAL_COMMUNITY): Payer: PPO | Attending: Internal Medicine | Admitting: Physical Therapy

## 2015-09-23 DIAGNOSIS — M545 Low back pain: Secondary | ICD-10-CM | POA: Insufficient documentation

## 2015-09-23 DIAGNOSIS — R2689 Other abnormalities of gait and mobility: Secondary | ICD-10-CM | POA: Diagnosis not present

## 2015-09-23 DIAGNOSIS — M79604 Pain in right leg: Secondary | ICD-10-CM | POA: Insufficient documentation

## 2015-09-23 DIAGNOSIS — M6281 Muscle weakness (generalized): Secondary | ICD-10-CM | POA: Insufficient documentation
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
## 2015-09-26 ENCOUNTER — Ambulatory Visit (HOSPITAL_COMMUNITY): Payer: PPO | Admitting: Physical Therapy

## 2015-09-26 DIAGNOSIS — M545 Low back pain: Secondary | ICD-10-CM

## 2015-09-26 DIAGNOSIS — R2689 Other abnormalities of gait and mobility: Secondary | ICD-10-CM

## 2015-09-26 DIAGNOSIS — M79604 Pain in right leg: Secondary | ICD-10-CM

## 2015-09-26 DIAGNOSIS — M6281 Muscle weakness (generalized): Secondary | ICD-10-CM
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
## 2015-10-02 ENCOUNTER — Ambulatory Visit (HOSPITAL_COMMUNITY): Payer: PPO | Admitting: Physical Therapy

## 2015-10-02 DIAGNOSIS — M6281 Muscle weakness (generalized): Secondary | ICD-10-CM

## 2015-10-02 DIAGNOSIS — M79604 Pain in right leg: Secondary | ICD-10-CM

## 2015-10-02 DIAGNOSIS — R2689 Other abnormalities of gait and mobility: Secondary | ICD-10-CM

## 2015-10-02 DIAGNOSIS — M545 Low back pain: Secondary | ICD-10-CM
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
## 2015-10-08 ENCOUNTER — Ambulatory Visit (HOSPITAL_COMMUNITY): Payer: PPO | Admitting: Physical Therapy

## 2015-10-08 DIAGNOSIS — M545 Low back pain: Secondary | ICD-10-CM | POA: Diagnosis not present

## 2015-10-08 DIAGNOSIS — R2689 Other abnormalities of gait and mobility: Secondary | ICD-10-CM

## 2015-10-08 DIAGNOSIS — M79604 Pain in right leg: Secondary | ICD-10-CM

## 2015-10-08 DIAGNOSIS — M6281 Muscle weakness (generalized): Secondary | ICD-10-CM

## 2015-10-08 NOTE — Patient Instructions (Signed)
HIP: Hamstrings - Long Sitting    Place one leg on surface with knee straight. Lean forward keeping back straight. Hold _30__ seconds. _3__ reps per set, _2__ sets per day   Piriformis Stretch, Sitting    Sit, one ankle on opposite knee, same-side hand on crossed knee. Push down on knee, keeping spine straight. Lean torso forward, with flat back, until tension is felt in hamstrings and gluteals of crossed-leg side. Hold _30__ seconds.  Repeat _3__ times per session. Do _2__ sessions per day.
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
## 2015-10-10 ENCOUNTER — Ambulatory Visit (HOSPITAL_COMMUNITY): Payer: PPO

## 2015-10-10 ENCOUNTER — Telehealth (HOSPITAL_COMMUNITY): Payer: Self-pay

## 2015-10-10 NOTE — Telephone Encounter (Signed)
cx - said she had to be at work at 3:00 and her appt was 2:30

## 2015-10-15 ENCOUNTER — Telehealth (HOSPITAL_COMMUNITY): Payer: Self-pay | Admitting: Physical Therapy

## 2015-10-15 ENCOUNTER — Ambulatory Visit (HOSPITAL_COMMUNITY): Payer: PPO | Admitting: Physical Therapy

## 2015-10-15 NOTE — Telephone Encounter (Signed)
Patient l/m at 1:22pm she just woke up she works 3rd shift and she is sick - can not come in today

## 2015-10-17 ENCOUNTER — Telehealth (HOSPITAL_COMMUNITY): Payer: Self-pay

## 2015-10-17 ENCOUNTER — Ambulatory Visit (HOSPITAL_COMMUNITY): Payer: PPO

## 2015-10-17 NOTE — Telephone Encounter (Signed)
Pt canceled apt for today due to sickness. -[address] [place]; [employer] [phone]

## 2015-10-22 ENCOUNTER — Ambulatory Visit (HOSPITAL_COMMUNITY): Payer: PPO | Attending: Internal Medicine | Admitting: Physical Therapy

## 2015-10-22 DIAGNOSIS — R2689 Other abnormalities of gait and mobility: Secondary | ICD-10-CM | POA: Insufficient documentation

## 2015-10-22 DIAGNOSIS — M79604 Pain in right leg: Secondary | ICD-10-CM

## 2015-10-22 DIAGNOSIS — M6281 Muscle weakness (generalized): Secondary | ICD-10-CM | POA: Diagnosis not present

## 2015-10-22 DIAGNOSIS — M545 Low back pain: Secondary | ICD-10-CM | POA: Insufficient documentation
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
## 2015-10-24 ENCOUNTER — Telehealth (HOSPITAL_COMMUNITY): Payer: Self-pay

## 2015-10-24 ENCOUNTER — Encounter (HOSPITAL_COMMUNITY): Payer: PPO

## 2015-10-24 NOTE — Telephone Encounter (Signed)
10/24/15 patient left a message to say that she had to cancel today because she was called in to work at 3:00

## 2015-10-29 ENCOUNTER — Ambulatory Visit (HOSPITAL_COMMUNITY): Payer: PPO

## 2015-10-29 DIAGNOSIS — M6281 Muscle weakness (generalized): Secondary | ICD-10-CM

## 2015-10-29 DIAGNOSIS — M79604 Pain in right leg: Secondary | ICD-10-CM

## 2015-10-29 DIAGNOSIS — R2689 Other abnormalities of gait and mobility: Secondary | ICD-10-CM

## 2015-10-29 DIAGNOSIS — M545 Low back pain: Secondary | ICD-10-CM
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
## 2015-10-31 ENCOUNTER — Ambulatory Visit (HOSPITAL_COMMUNITY): Payer: PPO | Admitting: Physical Therapy

## 2015-11-04 ENCOUNTER — Ambulatory Visit (HOSPITAL_COMMUNITY): Payer: PPO | Admitting: Physical Therapy

## 2015-11-04 DIAGNOSIS — M545 Low back pain: Secondary | ICD-10-CM

## 2015-11-04 DIAGNOSIS — M79604 Pain in right leg: Secondary | ICD-10-CM

## 2015-11-04 DIAGNOSIS — M6281 Muscle weakness (generalized): Secondary | ICD-10-CM

## 2015-11-04 DIAGNOSIS — R2689 Other abnormalities of gait and mobility: Secondary | ICD-10-CM
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
## 2015-11-06 ENCOUNTER — Encounter (HOSPITAL_COMMUNITY): Payer: PPO | Admitting: Physical Therapy

## 2015-11-08 DIAGNOSIS — I10 Essential (primary) hypertension: Secondary | ICD-10-CM | POA: Diagnosis not present

## 2015-11-08 DIAGNOSIS — R7301 Impaired fasting glucose: Secondary | ICD-10-CM | POA: Diagnosis not present

## 2015-11-11 ENCOUNTER — Ambulatory Visit (HOSPITAL_COMMUNITY): Payer: PPO | Attending: Internal Medicine | Admitting: Physical Therapy

## 2015-11-11 DIAGNOSIS — M79604 Pain in right leg: Secondary | ICD-10-CM | POA: Insufficient documentation

## 2015-11-11 DIAGNOSIS — M6281 Muscle weakness (generalized): Secondary | ICD-10-CM | POA: Diagnosis not present

## 2015-11-11 DIAGNOSIS — R2689 Other abnormalities of gait and mobility: Secondary | ICD-10-CM | POA: Insufficient documentation
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
## 2015-11-13 ENCOUNTER — Encounter (HOSPITAL_COMMUNITY): Payer: PPO

## 2015-11-14 DIAGNOSIS — E782 Mixed hyperlipidemia: Secondary | ICD-10-CM | POA: Diagnosis not present

## 2015-11-14 DIAGNOSIS — Z6841 Body Mass Index (BMI) 40.0 and over, adult: Secondary | ICD-10-CM | POA: Diagnosis not present

## 2015-11-14 DIAGNOSIS — I10 Essential (primary) hypertension: Secondary | ICD-10-CM | POA: Diagnosis not present

## 2015-11-14 DIAGNOSIS — R7301 Impaired fasting glucose: Secondary | ICD-10-CM | POA: Diagnosis not present

## 2015-11-14 DIAGNOSIS — Z0001 Encounter for general adult medical examination with abnormal findings: Secondary | ICD-10-CM | POA: Diagnosis not present

## 2015-11-14 DIAGNOSIS — R7989 Other specified abnormal findings of blood chemistry: Secondary | ICD-10-CM | POA: Diagnosis not present

## 2015-11-14 DIAGNOSIS — Z23 Encounter for immunization: Secondary | ICD-10-CM | POA: Diagnosis not present

## 2015-11-14 DIAGNOSIS — M25512 Pain in left shoulder: Secondary | ICD-10-CM | POA: Diagnosis not present

## 2015-11-14 DIAGNOSIS — J3089 Other allergic rhinitis: Secondary | ICD-10-CM | POA: Diagnosis not present

## 2016-05-12 DIAGNOSIS — Z1159 Encounter for screening for other viral diseases: Secondary | ICD-10-CM | POA: Diagnosis not present

## 2016-05-12 DIAGNOSIS — I10 Essential (primary) hypertension: Secondary | ICD-10-CM | POA: Diagnosis not present

## 2016-05-12 DIAGNOSIS — R7301 Impaired fasting glucose: Secondary | ICD-10-CM | POA: Diagnosis not present

## 2016-05-14 DIAGNOSIS — R7301 Impaired fasting glucose: Secondary | ICD-10-CM | POA: Diagnosis not present

## 2016-05-14 DIAGNOSIS — J309 Allergic rhinitis, unspecified: Secondary | ICD-10-CM | POA: Diagnosis not present

## 2016-05-14 DIAGNOSIS — Z23 Encounter for immunization: Secondary | ICD-10-CM | POA: Diagnosis not present

## 2016-05-14 DIAGNOSIS — I10 Essential (primary) hypertension: Secondary | ICD-10-CM | POA: Diagnosis not present

## 2016-05-14 DIAGNOSIS — D179 Benign lipomatous neoplasm, unspecified: Secondary | ICD-10-CM | POA: Diagnosis not present

## 2016-05-14 DIAGNOSIS — E782 Mixed hyperlipidemia: Secondary | ICD-10-CM | POA: Diagnosis not present

## 2016-05-14 DIAGNOSIS — Z0000 Encounter for general adult medical examination without abnormal findings: Secondary | ICD-10-CM | POA: Diagnosis not present

## 2016-05-14 DIAGNOSIS — Z6841 Body Mass Index (BMI) 40.0 and over, adult: Secondary | ICD-10-CM | POA: Diagnosis not present

## 2016-05-27 DIAGNOSIS — C44321 Squamous cell carcinoma of skin of nose: Secondary | ICD-10-CM | POA: Diagnosis not present

## 2016-05-27 DIAGNOSIS — D485 Neoplasm of uncertain behavior of skin: Secondary | ICD-10-CM | POA: Diagnosis not present

## 2016-05-27 DIAGNOSIS — D225 Melanocytic nevi of trunk: Secondary | ICD-10-CM | POA: Diagnosis not present

## 2016-05-27 DIAGNOSIS — L810 Postinflammatory hyperpigmentation: Secondary | ICD-10-CM | POA: Diagnosis not present

## 2016-06-15 DIAGNOSIS — C44301 Unspecified malignant neoplasm of skin of nose: Secondary | ICD-10-CM | POA: Diagnosis not present

## 2016-06-15 DIAGNOSIS — I10 Essential (primary) hypertension: Secondary | ICD-10-CM | POA: Diagnosis not present

## 2016-06-15 DIAGNOSIS — F4321 Adjustment disorder with depressed mood: Secondary | ICD-10-CM | POA: Diagnosis not present

## 2016-06-25 DIAGNOSIS — Z85828 Personal history of other malignant neoplasm of skin: Secondary | ICD-10-CM | POA: Diagnosis not present

## 2016-06-25 DIAGNOSIS — Z08 Encounter for follow-up examination after completed treatment for malignant neoplasm: Secondary | ICD-10-CM | POA: Diagnosis not present

## 2016-07-13 DIAGNOSIS — I10 Essential (primary) hypertension: Secondary | ICD-10-CM | POA: Diagnosis not present

## 2016-07-13 DIAGNOSIS — F4321 Adjustment disorder with depressed mood: Secondary | ICD-10-CM | POA: Diagnosis not present

## 2016-07-13 DIAGNOSIS — Z6841 Body Mass Index (BMI) 40.0 and over, adult: Secondary | ICD-10-CM | POA: Diagnosis not present

## 2016-07-13 DIAGNOSIS — R252 Cramp and spasm: Secondary | ICD-10-CM | POA: Diagnosis not present

## 2016-09-25 DIAGNOSIS — I10 Essential (primary) hypertension: Secondary | ICD-10-CM | POA: Diagnosis not present

## 2016-09-25 DIAGNOSIS — M6281 Muscle weakness (generalized): Secondary | ICD-10-CM | POA: Diagnosis not present

## 2016-09-25 DIAGNOSIS — Z6841 Body Mass Index (BMI) 40.0 and over, adult: Secondary | ICD-10-CM | POA: Diagnosis not present

## 2016-11-12 DIAGNOSIS — R7301 Impaired fasting glucose: Secondary | ICD-10-CM | POA: Diagnosis not present

## 2016-11-12 DIAGNOSIS — I10 Essential (primary) hypertension: Secondary | ICD-10-CM | POA: Diagnosis not present

## 2016-11-16 DIAGNOSIS — Z634 Disappearance and death of family member: Secondary | ICD-10-CM | POA: Diagnosis not present

## 2016-11-16 DIAGNOSIS — Z23 Encounter for immunization: Secondary | ICD-10-CM | POA: Diagnosis not present

## 2016-11-16 DIAGNOSIS — E6609 Other obesity due to excess calories: Secondary | ICD-10-CM | POA: Diagnosis not present

## 2016-11-16 DIAGNOSIS — E782 Mixed hyperlipidemia: Secondary | ICD-10-CM | POA: Diagnosis not present

## 2016-11-16 DIAGNOSIS — I10 Essential (primary) hypertension: Secondary | ICD-10-CM | POA: Diagnosis not present

## 2016-11-16 DIAGNOSIS — Z6841 Body Mass Index (BMI) 40.0 and over, adult: Secondary | ICD-10-CM | POA: Diagnosis not present

## 2017-01-20 ENCOUNTER — Encounter: Payer: Self-pay | Admitting: Internal Medicine

## 2017-03-26 DIAGNOSIS — I10 Essential (primary) hypertension: Secondary | ICD-10-CM | POA: Diagnosis not present

## 2017-03-26 DIAGNOSIS — R7301 Impaired fasting glucose: Secondary | ICD-10-CM | POA: Diagnosis not present

## 2017-03-26 DIAGNOSIS — E782 Mixed hyperlipidemia: Secondary | ICD-10-CM | POA: Diagnosis not present

## 2017-03-30 DIAGNOSIS — I10 Essential (primary) hypertension: Secondary | ICD-10-CM | POA: Diagnosis not present

## 2017-03-30 DIAGNOSIS — R7301 Impaired fasting glucose: Secondary | ICD-10-CM | POA: Diagnosis not present

## 2017-03-30 DIAGNOSIS — Z6841 Body Mass Index (BMI) 40.0 and over, adult: Secondary | ICD-10-CM | POA: Diagnosis not present

## 2017-03-30 DIAGNOSIS — E782 Mixed hyperlipidemia: Secondary | ICD-10-CM | POA: Diagnosis not present

## 2017-03-30 DIAGNOSIS — M79604 Pain in right leg: Secondary | ICD-10-CM | POA: Diagnosis not present

## 2017-05-11 DIAGNOSIS — R002 Palpitations: Secondary | ICD-10-CM | POA: Diagnosis not present

## 2017-05-11 DIAGNOSIS — R079 Chest pain, unspecified: Secondary | ICD-10-CM | POA: Diagnosis not present

## 2017-05-11 DIAGNOSIS — Z6841 Body Mass Index (BMI) 40.0 and over, adult: Secondary | ICD-10-CM | POA: Diagnosis not present

## 2017-06-18 DIAGNOSIS — H524 Presbyopia: Secondary | ICD-10-CM | POA: Diagnosis not present

## 2017-06-18 DIAGNOSIS — H5213 Myopia, bilateral: Secondary | ICD-10-CM | POA: Diagnosis not present

## 2017-06-23 ENCOUNTER — Encounter: Payer: Self-pay | Admitting: *Deleted

## 2017-06-23 ENCOUNTER — Ambulatory Visit: Payer: PPO | Admitting: Cardiovascular Disease

## 2017-06-23 VITALS — BP 138/80 | HR 60 | Ht 64.5 in | Wt 281.0 lb

## 2017-06-23 DIAGNOSIS — R002 Palpitations: Secondary | ICD-10-CM | POA: Diagnosis not present

## 2017-06-23 DIAGNOSIS — E7800 Pure hypercholesterolemia, unspecified: Secondary | ICD-10-CM

## 2017-06-23 DIAGNOSIS — I10 Essential (primary) hypertension: Secondary | ICD-10-CM

## 2017-06-23 DIAGNOSIS — F329 Major depressive disorder, single episode, unspecified: Secondary | ICD-10-CM | POA: Diagnosis not present

## 2017-06-23 DIAGNOSIS — F419 Anxiety disorder, unspecified: Secondary | ICD-10-CM

## 2017-06-23 DIAGNOSIS — F32A Depression, unspecified: Secondary | ICD-10-CM

## 2017-06-23 NOTE — Progress Notes (Signed)
CARDIOLOGY CONSULT NOTE  Patient ID: Beverly Young MRN: [id number] DOB/AGE: 06-09-1950 67 y.o.  Admit date: (Not on file) Primary Physician: Celene Squibb, MD Referring Physician: Dr. [place] Crane  Reason for Consultation: Palpitations and chest pain  HPI: Beverly Young is a 67 y.o. female who is being seen today for the evaluation of palpitations and chest pain at the request of Celene Squibb, MD.   Past medical history includes morbid obesity, hypertension, and hyperlipidemia.  I personally reviewed the ECG performed on 05/11/2017 which demonstrated sinus bradycardia, 59 bpm.  I reviewed labs performed on 03/26/2017: Hemoglobin 13.8, platelets 201, white blood cells 8, BUN 27, creatinine 0.95, sodium 145, potassium 4.4, total cholesterol 158, triglycerides 134, HDL 53, LDL 78, A1c 5.8%.  Palpitations were occurring more frequently last month.  Her PCP increased Lexapro from 5 up to 10 mg.  Since that time palpitations have been less frequent.  She does not feel like she was more anxious or stressed last month.  Palpitations were occurring on a daily basis and now occur about 4 times per week.  They last for a few seconds.  They are associated occasionally with some mild chest discomfort but she denies exertional chest pain.  She has chronic exertional dyspnea which has not gotten any worse and she attributes this to being obese.  She underwent a normal nuclear stress test in October 2015.  Echocardiogram in October 2015 demonstrated normal left ventricular systolic function, LVEF 60 to 65%, LVH, grade 1 diastolic dysfunction, and mild left atrial enlargement.  She denies lightheadedness, dizziness, and syncope.  Social history: Her husband, Beverly Young, used to be my patient.  He passed away 2 years ago.     Allergies  Allergen Reactions  . Codeine Other (See Comments)    Make her feel crazy    Current Outpatient Medications  Medication Sig Dispense Refill  . amLODipine (NORVASC) 5  MG tablet Take 5 mg by mouth daily.     . Calcium Carbonate-Vitamin D (CALCIUM-VITAMIN D) 500-200 MG-UNIT per tablet Take 1 tablet by mouth daily.      Marland Kitchen escitalopram (LEXAPRO) 5 MG tablet Take 2 tablets by mouth daily.     . Glucosamine Sulfate 1000 MG CAPS Take 1 capsule by mouth daily.      Marland Kitchen lovastatin (MEVACOR) 20 MG tablet Take 20 mg by mouth at bedtime.    . metoprolol succinate (TOPROL-XL) 50 MG 24 hr tablet Take 1 tablet by mouth daily.    . multivitamin-iron-minerals-folic acid (CENTRUM) chewable tablet Chew 1 tablet by mouth daily.      . naproxen (NAPROSYN) 250 MG tablet Take 2 tablets by mouth every other day.  1  . olmesartan-hydrochlorothiazide (BENICAR HCT) 40-12.5 MG tablet Take 1 tablet by mouth daily.    Marland Kitchen omeprazole (PRILOSEC) 20 MG capsule Take 1 capsule (20 mg total) by mouth daily. 30 capsule 3  . Potassium Gluconate 550 MG TABS Take 1 tablet by mouth daily.     No current facility-administered medications for this visit.     Past Medical History:  Diagnosis Date  . GERD (gastroesophageal reflux disease)   . 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 Hemorrhoids   . HTN (hypertension)   . Obesity   . Peptic stricture of esophagus    last dilated 07/2007    Past Surgical History:  Procedure Laterality Date  . CESAREAN SECTION    . COLONOSCOPY  07/11/07   external hemorrhoidal  otherwise normal rectum pancolonic diverticula, suboptimal prep on the right (Next 07/2012)  . ESOPHAGOGASTRODUODENOSCOPY  07/11/07   circumferential distal esophageal ersionc/soft peptic ring dilated 91F/small hiatal herina  . KNEE SURGERY     right  . PARTIAL HYSTERECTOMY      Social History   Socioeconomic History  . Marital status: Married    Spouse name: Not on file  . Number of children: Not on file  . Years of education: Not on file  . Highest education level: Not on file  Occupational History  . Not on file  Social Needs  . Financial resource strain: Not on file  . Food  insecurity:    Worry: Not on file    Inability: Not on file  . Transportation needs:    Medical: Not on file    Non-medical: Not on file  Tobacco Use  . Smoking status: Never Smoker  . Smokeless tobacco: Never Used  Substance and Sexual Activity  . Alcohol use: No  . Drug use: No  . Sexual activity: Not on file  Lifestyle  . Physical activity:    Days per week: Not on file    Minutes per session: Not on file  . Stress: Not on file  Relationships  . Social connections:    Talks on phone: Not on file    Gets together: Not on file    Attends religious service: Not on file    Active member of club or organization: Not on file    Attends meetings of clubs or organizations: Not on file    Relationship status: Not on file  . Intimate partner violence:    Fear of current or ex partner: Not on file    Emotionally abused: Not on file    Physically abused: Not on file    Forced sexual activity: Not on file  Other Topics Concern  . Not on file  Social History Narrative  . Not on file     No family history of premature CAD in 1st degree relatives.  Current Meds  Medication Sig  . amLODipine (NORVASC) 5 MG tablet Take 5 mg by mouth daily.   . Calcium Carbonate-Vitamin D (CALCIUM-VITAMIN D) 500-200 MG-UNIT per tablet Take 1 tablet by mouth daily.    Marland Kitchen escitalopram (LEXAPRO) 5 MG tablet Take 2 tablets by mouth daily.   . Glucosamine Sulfate 1000 MG CAPS Take 1 capsule by mouth daily.    Marland Kitchen lovastatin (MEVACOR) 20 MG tablet Take 20 mg by mouth at bedtime.  . metoprolol succinate (TOPROL-XL) 50 MG 24 hr tablet Take 1 tablet by mouth daily.  . multivitamin-iron-minerals-folic acid (CENTRUM) chewable tablet Chew 1 tablet by mouth daily.    . naproxen (NAPROSYN) 250 MG tablet Take 2 tablets by mouth every other day.  . olmesartan-hydrochlorothiazide (BENICAR HCT) 40-12.5 MG tablet Take 1 tablet by mouth daily.  Marland Kitchen omeprazole (PRILOSEC) 20 MG capsule Take 1 capsule (20 mg total) by mouth  daily.  . Potassium Gluconate 550 MG TABS Take 1 tablet by mouth daily.      Review of systems complete and found to be negative unless listed above in HPI    Physical exam Blood pressure 138/80, pulse 60, height 5' 4.5" (1.638 m), weight 281 lb (127.5 kg), SpO2 96 %. General: NAD Neck: No JVD, no thyromegaly or thyroid nodule.  Lungs: Clear to auscultation bilaterally with normal respiratory effort. CV: Nondisplaced PMI. Regular rate and rhythm, normal S1/S2, no S3/S4, no murmur.  No  peripheral edema.  No carotid bruit.   Abdomen: Soft, nontender, obese.  Skin: Intact without lesions or rashes.  Neurologic: Alert and oriented x 3.  Psych: Normal affect. Extremities: No clubbing or cyanosis.  HEENT: Normal.  ECG: Most recent ECG reviewed.   Labs: Lab Results  Component Value Date/Time   K 4.6 02/10/2013 02:10 PM   BUN 30 (H) 02/10/2013 02:10 PM   CREATININE 1.21 (H) 02/10/2013 02:10 PM   ALT 25 01/15/2011 01:01 PM   TSH 2.077 01/15/2011 01:01 PM   HGB 12.8 01/15/2011 01:01 PM     Lipids: Lab Results  Component Value Date/Time   LDLCALC 79 12/27/2007 09:10 PM   CHOL 165 12/27/2007 09:10 PM   TRIG 90 12/27/2007 09:10 PM   HDL 68 12/27/2007 09:10 PM        ASSESSMENT AND PLAN:   1.  Palpitations: She may be experiencing symptomatic PACs or PVCs.  I will obtain a 1 week event monitor.  2.  Hypertension: Controlled on present therapy.  No changes.  3.  Hyper lipidemia: Lipids reviewed above.  Continue lovastatin 20 mg.  4.  Anxiety and depression: Continue Lexapro 10 mg.    Disposition: Follow up in 2 months  Signed: Kate Sable, M.D., F.A.C.C.  06/23/2017, 8:47 AM

## 2017-06-23 NOTE — Patient Instructions (Signed)
Medication Instructions:  Your physician recommends that you continue on your current medications as directed. Please refer to the Current Medication list given to you today.   Labwork: NONE  Testing/Procedures: Your physician has recommended that you wear an event monitor. Event monitors are medical devices that record the heart's electrical activity. Doctors most often [place] these monitors to diagnose arrhythmias. Arrhythmias are problems with the speed or rhythm of the heartbeat. The monitor is a small, portable device. You can wear one while you do your normal daily activities. This is usually used to diagnose what is causing palpitations/syncope (passing out). 7 DAY    Follow-Up: Your physician recommends that you schedule a follow-up appointment in: 2 MONTHS    Any Other Special Instructions Will Be Listed Below (If Applicable).     If you need a refill on your cardiac medications before your next appointment, please call your pharmacy.

## 2017-06-28 ENCOUNTER — Telehealth: Payer: Self-pay

## 2017-06-28 ENCOUNTER — Ambulatory Visit (INDEPENDENT_AMBULATORY_CARE_PROVIDER_SITE_OTHER): Payer: PPO

## 2017-06-28 DIAGNOSIS — R002 Palpitations: Secondary | ICD-10-CM

## 2017-06-28 NOTE — Telephone Encounter (Signed)
Received fax from [employer] that pt received event monitor but they do not have a baseline or any transmissions sent yet. I left message asking pt to call me

## 2017-06-29 DIAGNOSIS — R002 Palpitations: Secondary | ICD-10-CM | POA: Diagnosis not present

## 2017-08-23 ENCOUNTER — Ambulatory Visit: Payer: PPO | Admitting: Cardiovascular Disease

## 2017-08-23 ENCOUNTER — Encounter: Payer: Self-pay | Admitting: Cardiovascular Disease

## 2017-08-23 VITALS — BP 132/76 | HR 84 | Ht 64.5 in | Wt 280.0 lb

## 2017-08-23 DIAGNOSIS — E7800 Pure hypercholesterolemia, unspecified: Secondary | ICD-10-CM | POA: Diagnosis not present

## 2017-08-23 DIAGNOSIS — R002 Palpitations: Secondary | ICD-10-CM | POA: Diagnosis not present

## 2017-08-23 DIAGNOSIS — F419 Anxiety disorder, unspecified: Secondary | ICD-10-CM | POA: Diagnosis not present

## 2017-08-23 DIAGNOSIS — F329 Major depressive disorder, single episode, unspecified: Secondary | ICD-10-CM

## 2017-08-23 DIAGNOSIS — I10 Essential (primary) hypertension: Secondary | ICD-10-CM

## 2017-08-23 DIAGNOSIS — R0789 Other chest pain: Secondary | ICD-10-CM | POA: Diagnosis not present

## 2017-08-23 NOTE — Patient Instructions (Signed)
Medication Instructions:  Your physician recommends that you continue on your current medications as directed. Please refer to the Current Medication list given to you today.   Labwork: NONE   Testing/Procedures: NONE   Follow-Up: Your physician recommends that you schedule a follow-up appointment As Needed    Any Other Special Instructions Will Be Listed Below (If Applicable).     If you need a refill on your cardiac medications before your next appointment, please call your pharmacy.  Thank you for choosing [facility]!   

## 2017-08-23 NOTE — Progress Notes (Signed)
SUBJECTIVE: The patient presents for follow-up of palpitations.  Event monitoring was normal and demonstrated sinus rhythm with no arrhythmias.  Today she denies any significant palpitations.  She has had some upper retrosternal chest discomfort with some dysphagia with solids.  She told me she is due for a colonoscopy and an upper endoscopy.  She takes Prilosec.    Social history: Her husband, Gwyndolyn Saxon, used to be my patient.  He passed away 2 years ago.  Review of Systems: As per "subjective", otherwise negative.  Allergies  Allergen Reactions  . Codeine Other (See Comments)    Make her feel crazy    Current Outpatient Medications  Medication Sig Dispense Refill  . amLODipine (NORVASC) 5 MG tablet Take 5 mg by mouth daily.     . Calcium Carbonate-Vitamin D (CALCIUM-VITAMIN D) 500-200 MG-UNIT per tablet Take 1 tablet by mouth daily.      Marland Kitchen escitalopram (LEXAPRO) 5 MG tablet Take 2 tablets by mouth daily.     . Glucosamine Sulfate 1000 MG CAPS Take 1 capsule by mouth daily.      Marland Kitchen lovastatin (MEVACOR) 20 MG tablet Take 20 mg by mouth at bedtime.    . metoprolol succinate (TOPROL-XL) 50 MG 24 hr tablet Take 1 tablet by mouth daily.    . multivitamin-iron-minerals-folic acid (CENTRUM) chewable tablet Chew 1 tablet by mouth daily.      . naproxen (NAPROSYN) 250 MG tablet Take 2 tablets by mouth every other day.  1  . olmesartan-hydrochlorothiazide (BENICAR HCT) 40-12.5 MG tablet Take 1 tablet by mouth daily.    Marland Kitchen omeprazole (PRILOSEC) 20 MG capsule Take 1 capsule (20 mg total) by mouth daily. 30 capsule 3  . Potassium Gluconate 550 MG TABS Take 1 tablet by mouth daily.     No current facility-administered medications for this visit.     Past Medical History:  Diagnosis Date  . GERD (gastroesophageal reflux disease)   . 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 Hemorrhoids   . HTN (hypertension)   . Obesity   . Peptic stricture of esophagus    last dilated 07/2007    Past  Surgical History:  Procedure Laterality Date  . CESAREAN SECTION    . COLONOSCOPY  07/11/07   external hemorrhoidal otherwise normal rectum pancolonic diverticula, suboptimal prep on the right (Next 07/2012)  . ESOPHAGOGASTRODUODENOSCOPY  07/11/07   circumferential distal esophageal ersionc/soft peptic ring dilated 34F/small hiatal herina  . KNEE SURGERY     right  . PARTIAL HYSTERECTOMY      Social History   Socioeconomic History  . Marital status: Married    Spouse name: Not on file  . Number of children: Not on file  . Years of education: Not on file  . Highest education level: Not on file  Occupational History  . Not on file  Social Needs  . Financial resource strain: Not on file  . Food insecurity:    Worry: Not on file    Inability: Not on file  . Transportation needs:    Medical: Not on file    Non-medical: Not on file  Tobacco Use  . Smoking status: Never Smoker  . Smokeless tobacco: Never Used  Substance and Sexual Activity  . Alcohol use: No  . Drug use: No  . Sexual activity: Not on file  Lifestyle  . Physical activity:    Days per week: Not on file    Minutes per session: Not on file  .  Stress: Not on file  Relationships  . Social connections:    Talks on phone: Not on file    Gets together: Not on file    Attends religious service: Not on file    Active member of club or organization: Not on file    Attends meetings of clubs or organizations: Not on file    Relationship status: Not on file  . Intimate partner violence:    Fear of current or ex partner: Not on file    Emotionally abused: Not on file    Physically abused: Not on file    Forced sexual activity: Not on file  Other Topics Concern  . Not on file  Social History Narrative  . Not on file     Vitals:   08/23/17 0812  BP: 132/76  Pulse: 84  SpO2: 95%  Weight: 280 lb (127 kg)  Height: 5' 4.5" (1.638 m)    Wt Readings from Last 3 Encounters:  08/23/17 280 lb (127 kg)  06/23/17  281 lb (127.5 kg)  12/12/13 282 lb (127.9 kg)     PHYSICAL EXAM General: NAD HEENT: Normal. Neck: No JVD, no thyromegaly. Lungs: Clear to auscultation bilaterally with normal respiratory effort. CV: Regular rate and rhythm, normal S1/S2, no S3/S4, no murmur. No pretibial or periankle edema.  No carotid bruit.   Abdomen: Soft, nontender, no distention.  Neurologic: Alert and oriented.  Psych: Normal affect. Skin: Normal. Musculoskeletal: No gross deformities.    ECG: Reviewed above under Subjective   Labs: Lab Results  Component Value Date/Time   K 4.6 02/10/2013 02:10 PM   BUN 30 (H) 02/10/2013 02:10 PM   CREATININE 1.21 (H) 02/10/2013 02:10 PM   ALT 25 01/15/2011 01:01 PM   TSH 2.077 01/15/2011 01:01 PM   HGB 12.8 01/15/2011 01:01 PM     Lipids: Lab Results  Component Value Date/Time   LDLCALC 79 12/27/2007 09:10 PM   CHOL 165 12/27/2007 09:10 PM   TRIG 90 12/27/2007 09:10 PM   HDL 68 12/27/2007 09:10 PM       ASSESSMENT AND PLAN: 1.  Palpitations: Event monitoring was normal as detailed above.  No further cardiac testing is indicated at this time.  2.  Hypertension: Controlled on present therapy.  No changes.  3.  Hyperlipidemia: Lipids previously reviewed.  Continue lovastatin 20 mg.  4.  Anxiety and depression: Continue Lexapro 10 mg.  5.  Chest discomfort: This appears to be GI related.  She appears to have an upcoming upper endoscopy as per her.  She takes Prilosec.  This is nonexertional and does not appear to be cardiac related.    Disposition: Follow up as needed   Kate Sable, M.D., F.A.C.C.

## 2017-09-28 DIAGNOSIS — F4321 Adjustment disorder with depressed mood: Secondary | ICD-10-CM | POA: Diagnosis not present

## 2017-09-28 DIAGNOSIS — F339 Major depressive disorder, recurrent, unspecified: Secondary | ICD-10-CM | POA: Diagnosis not present

## 2017-09-28 DIAGNOSIS — R002 Palpitations: Secondary | ICD-10-CM | POA: Diagnosis not present

## 2017-09-28 DIAGNOSIS — I10 Essential (primary) hypertension: Secondary | ICD-10-CM | POA: Diagnosis not present

## 2017-09-28 DIAGNOSIS — K219 Gastro-esophageal reflux disease without esophagitis: Secondary | ICD-10-CM | POA: Diagnosis not present

## 2017-09-28 DIAGNOSIS — R7301 Impaired fasting glucose: Secondary | ICD-10-CM | POA: Diagnosis not present

## 2017-09-28 DIAGNOSIS — E782 Mixed hyperlipidemia: Secondary | ICD-10-CM | POA: Diagnosis not present

## 2017-09-28 DIAGNOSIS — J449 Chronic obstructive pulmonary disease, unspecified: Secondary | ICD-10-CM | POA: Diagnosis not present

## 2017-10-01 DIAGNOSIS — R7301 Impaired fasting glucose: Secondary | ICD-10-CM | POA: Diagnosis not present

## 2017-10-01 DIAGNOSIS — B3789 Other sites of candidiasis: Secondary | ICD-10-CM | POA: Diagnosis not present

## 2017-10-01 DIAGNOSIS — R252 Cramp and spasm: Secondary | ICD-10-CM | POA: Diagnosis not present

## 2017-10-01 DIAGNOSIS — M79604 Pain in right leg: Secondary | ICD-10-CM | POA: Diagnosis not present

## 2017-10-01 DIAGNOSIS — Z6841 Body Mass Index (BMI) 40.0 and over, adult: Secondary | ICD-10-CM | POA: Diagnosis not present

## 2017-10-01 DIAGNOSIS — I10 Essential (primary) hypertension: Secondary | ICD-10-CM | POA: Diagnosis not present

## 2017-10-01 DIAGNOSIS — R944 Abnormal results of kidney function studies: Secondary | ICD-10-CM | POA: Diagnosis not present

## 2017-10-01 DIAGNOSIS — E782 Mixed hyperlipidemia: Secondary | ICD-10-CM | POA: Diagnosis not present

## 2017-10-05 ENCOUNTER — Other Ambulatory Visit: Payer: Self-pay | Admitting: Internal Medicine

## 2017-10-20 ENCOUNTER — Other Ambulatory Visit (HOSPITAL_COMMUNITY): Payer: Self-pay | Admitting: Internal Medicine

## 2017-10-20 DIAGNOSIS — Z1231 Encounter for screening mammogram for malignant neoplasm of breast: Secondary | ICD-10-CM

## 2017-11-01 ENCOUNTER — Ambulatory Visit (HOSPITAL_COMMUNITY): Payer: PPO

## 2017-11-01 DIAGNOSIS — K219 Gastro-esophageal reflux disease without esophagitis: Secondary | ICD-10-CM | POA: Diagnosis not present

## 2017-11-01 DIAGNOSIS — F339 Major depressive disorder, recurrent, unspecified: Secondary | ICD-10-CM | POA: Diagnosis not present

## 2017-11-01 DIAGNOSIS — R7301 Impaired fasting glucose: Secondary | ICD-10-CM | POA: Diagnosis not present

## 2017-11-01 DIAGNOSIS — I10 Essential (primary) hypertension: Secondary | ICD-10-CM | POA: Diagnosis not present

## 2017-11-01 DIAGNOSIS — J449 Chronic obstructive pulmonary disease, unspecified: Secondary | ICD-10-CM | POA: Diagnosis not present

## 2017-11-01 DIAGNOSIS — E782 Mixed hyperlipidemia: Secondary | ICD-10-CM | POA: Diagnosis not present

## 2017-11-04 ENCOUNTER — Ambulatory Visit (HOSPITAL_COMMUNITY)
Admission: RE | Admit: 2017-11-04 | Discharge: 2017-11-04 | Disposition: A | Discharge status: Home / Self Care | Payer: PPO | Source: Ambulatory Visit | Attending: Internal Medicine | Admitting: Internal Medicine

## 2017-11-04 DIAGNOSIS — Z1231 Encounter for screening mammogram for malignant neoplasm of breast: Secondary | ICD-10-CM | POA: Insufficient documentation

## 2017-11-29 DIAGNOSIS — J449 Chronic obstructive pulmonary disease, unspecified: Secondary | ICD-10-CM | POA: Diagnosis not present

## 2017-11-29 DIAGNOSIS — E782 Mixed hyperlipidemia: Secondary | ICD-10-CM | POA: Diagnosis not present

## 2017-11-29 DIAGNOSIS — R7301 Impaired fasting glucose: Secondary | ICD-10-CM | POA: Diagnosis not present

## 2017-11-29 DIAGNOSIS — I10 Essential (primary) hypertension: Secondary | ICD-10-CM | POA: Diagnosis not present

## 2017-11-29 DIAGNOSIS — F339 Major depressive disorder, recurrent, unspecified: Secondary | ICD-10-CM | POA: Diagnosis not present

## 2017-11-29 DIAGNOSIS — K219 Gastro-esophageal reflux disease without esophagitis: Secondary | ICD-10-CM | POA: Diagnosis not present

## 2017-12-31 DIAGNOSIS — I10 Essential (primary) hypertension: Secondary | ICD-10-CM | POA: Diagnosis not present

## 2017-12-31 DIAGNOSIS — F339 Major depressive disorder, recurrent, unspecified: Secondary | ICD-10-CM | POA: Diagnosis not present

## 2017-12-31 DIAGNOSIS — R944 Abnormal results of kidney function studies: Secondary | ICD-10-CM | POA: Diagnosis not present

## 2017-12-31 DIAGNOSIS — K219 Gastro-esophageal reflux disease without esophagitis: Secondary | ICD-10-CM | POA: Diagnosis not present

## 2017-12-31 DIAGNOSIS — R7301 Impaired fasting glucose: Secondary | ICD-10-CM | POA: Diagnosis not present

## 2017-12-31 DIAGNOSIS — J449 Chronic obstructive pulmonary disease, unspecified: Secondary | ICD-10-CM | POA: Diagnosis not present

## 2017-12-31 DIAGNOSIS — E782 Mixed hyperlipidemia: Secondary | ICD-10-CM | POA: Diagnosis not present

## 2018-02-11 DIAGNOSIS — R7301 Impaired fasting glucose: Secondary | ICD-10-CM | POA: Diagnosis not present

## 2018-02-11 DIAGNOSIS — E782 Mixed hyperlipidemia: Secondary | ICD-10-CM | POA: Diagnosis not present

## 2018-02-11 DIAGNOSIS — I10 Essential (primary) hypertension: Secondary | ICD-10-CM | POA: Diagnosis not present

## 2018-02-15 DIAGNOSIS — E782 Mixed hyperlipidemia: Secondary | ICD-10-CM | POA: Diagnosis not present

## 2018-02-15 DIAGNOSIS — I10 Essential (primary) hypertension: Secondary | ICD-10-CM | POA: Diagnosis not present

## 2018-02-15 DIAGNOSIS — F339 Major depressive disorder, recurrent, unspecified: Secondary | ICD-10-CM | POA: Diagnosis not present

## 2018-02-15 DIAGNOSIS — R7301 Impaired fasting glucose: Secondary | ICD-10-CM | POA: Diagnosis not present

## 2018-02-15 DIAGNOSIS — R945 Abnormal results of liver function studies: Secondary | ICD-10-CM | POA: Diagnosis not present

## 2018-02-15 DIAGNOSIS — R252 Cramp and spasm: Secondary | ICD-10-CM | POA: Diagnosis not present

## 2018-02-15 DIAGNOSIS — Z6841 Body Mass Index (BMI) 40.0 and over, adult: Secondary | ICD-10-CM | POA: Diagnosis not present

## 2018-02-17 ENCOUNTER — Encounter: Payer: Self-pay | Admitting: Internal Medicine

## 2018-02-17 DIAGNOSIS — I10 Essential (primary) hypertension: Secondary | ICD-10-CM | POA: Diagnosis not present

## 2018-02-17 DIAGNOSIS — F339 Major depressive disorder, recurrent, unspecified: Secondary | ICD-10-CM | POA: Diagnosis not present

## 2018-02-17 DIAGNOSIS — E782 Mixed hyperlipidemia: Secondary | ICD-10-CM | POA: Diagnosis not present

## 2018-02-17 DIAGNOSIS — K219 Gastro-esophageal reflux disease without esophagitis: Secondary | ICD-10-CM | POA: Diagnosis not present

## 2018-02-17 DIAGNOSIS — R7301 Impaired fasting glucose: Secondary | ICD-10-CM | POA: Diagnosis not present

## 2018-02-17 DIAGNOSIS — J449 Chronic obstructive pulmonary disease, unspecified: Secondary | ICD-10-CM | POA: Diagnosis not present

## 2018-03-15 DIAGNOSIS — R7301 Impaired fasting glucose: Secondary | ICD-10-CM | POA: Diagnosis not present

## 2018-03-15 DIAGNOSIS — K219 Gastro-esophageal reflux disease without esophagitis: Secondary | ICD-10-CM | POA: Diagnosis not present

## 2018-03-15 DIAGNOSIS — I10 Essential (primary) hypertension: Secondary | ICD-10-CM | POA: Diagnosis not present

## 2018-03-15 DIAGNOSIS — F339 Major depressive disorder, recurrent, unspecified: Secondary | ICD-10-CM | POA: Diagnosis not present

## 2018-03-15 DIAGNOSIS — J449 Chronic obstructive pulmonary disease, unspecified: Secondary | ICD-10-CM | POA: Diagnosis not present

## 2018-03-15 DIAGNOSIS — E782 Mixed hyperlipidemia: Secondary | ICD-10-CM | POA: Diagnosis not present

## 2018-04-13 DIAGNOSIS — R7301 Impaired fasting glucose: Secondary | ICD-10-CM | POA: Diagnosis not present

## 2018-04-13 DIAGNOSIS — J449 Chronic obstructive pulmonary disease, unspecified: Secondary | ICD-10-CM | POA: Diagnosis not present

## 2018-04-13 DIAGNOSIS — E782 Mixed hyperlipidemia: Secondary | ICD-10-CM | POA: Diagnosis not present

## 2018-04-13 DIAGNOSIS — I10 Essential (primary) hypertension: Secondary | ICD-10-CM | POA: Diagnosis not present

## 2018-04-28 ENCOUNTER — Ambulatory Visit: Payer: PPO | Admitting: Gastroenterology

## 2018-05-19 DIAGNOSIS — R7301 Impaired fasting glucose: Secondary | ICD-10-CM | POA: Diagnosis not present

## 2018-05-19 DIAGNOSIS — J449 Chronic obstructive pulmonary disease, unspecified: Secondary | ICD-10-CM | POA: Diagnosis not present

## 2018-05-19 DIAGNOSIS — E782 Mixed hyperlipidemia: Secondary | ICD-10-CM | POA: Diagnosis not present

## 2018-05-19 DIAGNOSIS — I10 Essential (primary) hypertension: Secondary | ICD-10-CM | POA: Diagnosis not present

## 2018-05-19 DIAGNOSIS — K219 Gastro-esophageal reflux disease without esophagitis: Secondary | ICD-10-CM | POA: Diagnosis not present

## 2018-05-19 DIAGNOSIS — F339 Major depressive disorder, recurrent, unspecified: Secondary | ICD-10-CM | POA: Diagnosis not present

## 2018-06-10 ENCOUNTER — Other Ambulatory Visit: Payer: Self-pay | Admitting: Internal Medicine

## 2018-06-10 DIAGNOSIS — Z780 Asymptomatic menopausal state: Secondary | ICD-10-CM

## 2018-06-10 DIAGNOSIS — Z0000 Encounter for general adult medical examination without abnormal findings: Secondary | ICD-10-CM | POA: Diagnosis not present

## 2018-06-14 DIAGNOSIS — I10 Essential (primary) hypertension: Secondary | ICD-10-CM | POA: Diagnosis not present

## 2018-06-14 DIAGNOSIS — E782 Mixed hyperlipidemia: Secondary | ICD-10-CM | POA: Diagnosis not present

## 2018-06-14 DIAGNOSIS — J449 Chronic obstructive pulmonary disease, unspecified: Secondary | ICD-10-CM | POA: Diagnosis not present

## 2018-06-14 DIAGNOSIS — R7301 Impaired fasting glucose: Secondary | ICD-10-CM | POA: Diagnosis not present

## 2018-06-30 ENCOUNTER — Ambulatory Visit: Payer: PPO | Admitting: Gastroenterology

## 2018-07-19 DIAGNOSIS — I10 Essential (primary) hypertension: Secondary | ICD-10-CM | POA: Diagnosis not present

## 2018-07-19 DIAGNOSIS — E782 Mixed hyperlipidemia: Secondary | ICD-10-CM | POA: Diagnosis not present

## 2018-07-19 DIAGNOSIS — R7301 Impaired fasting glucose: Secondary | ICD-10-CM | POA: Diagnosis not present

## 2018-07-19 DIAGNOSIS — J449 Chronic obstructive pulmonary disease, unspecified: Secondary | ICD-10-CM | POA: Diagnosis not present

## 2018-08-06 DIAGNOSIS — R2243 Localized swelling, mass and lump, lower limb, bilateral: Secondary | ICD-10-CM | POA: Diagnosis not present

## 2018-08-06 DIAGNOSIS — I10 Essential (primary) hypertension: Secondary | ICD-10-CM | POA: Diagnosis not present

## 2018-08-25 ENCOUNTER — Ambulatory Visit: Payer: PPO | Admitting: Gastroenterology

## 2018-08-26 DIAGNOSIS — K219 Gastro-esophageal reflux disease without esophagitis: Secondary | ICD-10-CM | POA: Diagnosis not present

## 2018-08-26 DIAGNOSIS — I10 Essential (primary) hypertension: Secondary | ICD-10-CM | POA: Diagnosis not present

## 2018-08-26 DIAGNOSIS — R7301 Impaired fasting glucose: Secondary | ICD-10-CM | POA: Diagnosis not present

## 2018-08-26 DIAGNOSIS — J449 Chronic obstructive pulmonary disease, unspecified: Secondary | ICD-10-CM | POA: Diagnosis not present

## 2018-08-26 DIAGNOSIS — E782 Mixed hyperlipidemia: Secondary | ICD-10-CM | POA: Diagnosis not present

## 2018-08-26 DIAGNOSIS — F339 Major depressive disorder, recurrent, unspecified: Secondary | ICD-10-CM | POA: Diagnosis not present

## 2018-08-29 DIAGNOSIS — I10 Essential (primary) hypertension: Secondary | ICD-10-CM | POA: Diagnosis not present

## 2018-08-29 DIAGNOSIS — E782 Mixed hyperlipidemia: Secondary | ICD-10-CM | POA: Diagnosis not present

## 2018-08-29 DIAGNOSIS — R7301 Impaired fasting glucose: Secondary | ICD-10-CM | POA: Diagnosis not present

## 2018-09-01 DIAGNOSIS — R7301 Impaired fasting glucose: Secondary | ICD-10-CM | POA: Diagnosis not present

## 2018-09-01 DIAGNOSIS — R252 Cramp and spasm: Secondary | ICD-10-CM | POA: Diagnosis not present

## 2018-09-01 DIAGNOSIS — F339 Major depressive disorder, recurrent, unspecified: Secondary | ICD-10-CM | POA: Diagnosis not present

## 2018-09-01 DIAGNOSIS — R7303 Prediabetes: Secondary | ICD-10-CM | POA: Diagnosis not present

## 2018-09-01 DIAGNOSIS — R945 Abnormal results of liver function studies: Secondary | ICD-10-CM | POA: Diagnosis not present

## 2018-09-01 DIAGNOSIS — E782 Mixed hyperlipidemia: Secondary | ICD-10-CM | POA: Diagnosis not present

## 2018-09-01 DIAGNOSIS — Z6841 Body Mass Index (BMI) 40.0 and over, adult: Secondary | ICD-10-CM | POA: Diagnosis not present

## 2018-09-01 DIAGNOSIS — I10 Essential (primary) hypertension: Secondary | ICD-10-CM | POA: Diagnosis not present

## 2018-09-16 DIAGNOSIS — H524 Presbyopia: Secondary | ICD-10-CM | POA: Diagnosis not present

## 2018-09-16 DIAGNOSIS — H2513 Age-related nuclear cataract, bilateral: Secondary | ICD-10-CM | POA: Diagnosis not present

## 2018-09-16 DIAGNOSIS — H5213 Myopia, bilateral: Secondary | ICD-10-CM | POA: Diagnosis not present

## 2018-09-21 DIAGNOSIS — E782 Mixed hyperlipidemia: Secondary | ICD-10-CM | POA: Diagnosis not present

## 2018-09-21 DIAGNOSIS — R002 Palpitations: Secondary | ICD-10-CM | POA: Diagnosis not present

## 2018-09-21 DIAGNOSIS — R079 Chest pain, unspecified: Secondary | ICD-10-CM | POA: Diagnosis not present

## 2018-09-21 DIAGNOSIS — R944 Abnormal results of kidney function studies: Secondary | ICD-10-CM | POA: Diagnosis not present

## 2018-09-21 DIAGNOSIS — K219 Gastro-esophageal reflux disease without esophagitis: Secondary | ICD-10-CM | POA: Diagnosis not present

## 2018-09-21 DIAGNOSIS — R7301 Impaired fasting glucose: Secondary | ICD-10-CM | POA: Diagnosis not present

## 2018-09-21 DIAGNOSIS — F4321 Adjustment disorder with depressed mood: Secondary | ICD-10-CM | POA: Diagnosis not present

## 2018-09-21 DIAGNOSIS — C44301 Unspecified malignant neoplasm of skin of nose: Secondary | ICD-10-CM | POA: Diagnosis not present

## 2018-09-21 DIAGNOSIS — R600 Localized edema: Secondary | ICD-10-CM | POA: Diagnosis not present

## 2018-09-21 DIAGNOSIS — R7303 Prediabetes: Secondary | ICD-10-CM | POA: Diagnosis not present

## 2018-09-21 DIAGNOSIS — F339 Major depressive disorder, recurrent, unspecified: Secondary | ICD-10-CM | POA: Diagnosis not present

## 2018-09-21 DIAGNOSIS — I10 Essential (primary) hypertension: Secondary | ICD-10-CM | POA: Diagnosis not present

## 2018-09-21 DIAGNOSIS — Z6841 Body Mass Index (BMI) 40.0 and over, adult: Secondary | ICD-10-CM | POA: Diagnosis not present

## 2018-10-13 DIAGNOSIS — R944 Abnormal results of kidney function studies: Secondary | ICD-10-CM | POA: Diagnosis not present

## 2018-10-13 DIAGNOSIS — R7303 Prediabetes: Secondary | ICD-10-CM | POA: Diagnosis not present

## 2018-10-13 DIAGNOSIS — I10 Essential (primary) hypertension: Secondary | ICD-10-CM | POA: Diagnosis not present

## 2018-10-13 DIAGNOSIS — R600 Localized edema: Secondary | ICD-10-CM | POA: Diagnosis not present

## 2018-11-11 DIAGNOSIS — R7303 Prediabetes: Secondary | ICD-10-CM | POA: Diagnosis not present

## 2018-11-11 DIAGNOSIS — R944 Abnormal results of kidney function studies: Secondary | ICD-10-CM | POA: Diagnosis not present

## 2018-11-11 DIAGNOSIS — I10 Essential (primary) hypertension: Secondary | ICD-10-CM | POA: Diagnosis not present

## 2018-11-11 DIAGNOSIS — R600 Localized edema: Secondary | ICD-10-CM | POA: Diagnosis not present

## 2018-12-27 DIAGNOSIS — I10 Essential (primary) hypertension: Secondary | ICD-10-CM | POA: Diagnosis not present

## 2018-12-27 DIAGNOSIS — R7301 Impaired fasting glucose: Secondary | ICD-10-CM | POA: Diagnosis not present

## 2018-12-27 DIAGNOSIS — E782 Mixed hyperlipidemia: Secondary | ICD-10-CM | POA: Diagnosis not present

## 2018-12-27 DIAGNOSIS — R7303 Prediabetes: Secondary | ICD-10-CM | POA: Diagnosis not present

## 2018-12-29 DIAGNOSIS — R252 Cramp and spasm: Secondary | ICD-10-CM | POA: Diagnosis not present

## 2018-12-29 DIAGNOSIS — I10 Essential (primary) hypertension: Secondary | ICD-10-CM | POA: Diagnosis not present

## 2018-12-29 DIAGNOSIS — E782 Mixed hyperlipidemia: Secondary | ICD-10-CM | POA: Diagnosis not present

## 2018-12-29 DIAGNOSIS — Z6841 Body Mass Index (BMI) 40.0 and over, adult: Secondary | ICD-10-CM | POA: Diagnosis not present

## 2018-12-29 DIAGNOSIS — Z23 Encounter for immunization: Secondary | ICD-10-CM | POA: Diagnosis not present

## 2018-12-29 DIAGNOSIS — R945 Abnormal results of liver function studies: Secondary | ICD-10-CM | POA: Diagnosis not present

## 2018-12-29 DIAGNOSIS — F339 Major depressive disorder, recurrent, unspecified: Secondary | ICD-10-CM | POA: Diagnosis not present

## 2018-12-29 DIAGNOSIS — R7301 Impaired fasting glucose: Secondary | ICD-10-CM | POA: Diagnosis not present

## 2018-12-29 DIAGNOSIS — Z0001 Encounter for general adult medical examination with abnormal findings: Secondary | ICD-10-CM | POA: Diagnosis not present

## 2018-12-29 DIAGNOSIS — R7303 Prediabetes: Secondary | ICD-10-CM | POA: Diagnosis not present

## 2019-01-04 DIAGNOSIS — R944 Abnormal results of kidney function studies: Secondary | ICD-10-CM | POA: Diagnosis not present

## 2019-01-04 DIAGNOSIS — R600 Localized edema: Secondary | ICD-10-CM | POA: Diagnosis not present

## 2019-01-04 DIAGNOSIS — I10 Essential (primary) hypertension: Secondary | ICD-10-CM | POA: Diagnosis not present

## 2019-01-04 DIAGNOSIS — R7303 Prediabetes: Secondary | ICD-10-CM | POA: Diagnosis not present

## 2019-02-01 DIAGNOSIS — E7849 Other hyperlipidemia: Secondary | ICD-10-CM | POA: Diagnosis not present

## 2019-02-01 DIAGNOSIS — J449 Chronic obstructive pulmonary disease, unspecified: Secondary | ICD-10-CM | POA: Diagnosis not present

## 2019-02-01 DIAGNOSIS — R600 Localized edema: Secondary | ICD-10-CM | POA: Diagnosis not present

## 2019-02-01 DIAGNOSIS — R944 Abnormal results of kidney function studies: Secondary | ICD-10-CM | POA: Diagnosis not present

## 2019-02-01 DIAGNOSIS — I10 Essential (primary) hypertension: Secondary | ICD-10-CM | POA: Diagnosis not present

## 2019-02-01 DIAGNOSIS — R7303 Prediabetes: Secondary | ICD-10-CM | POA: Diagnosis not present

## 2019-03-03 DIAGNOSIS — I10 Essential (primary) hypertension: Secondary | ICD-10-CM | POA: Diagnosis not present

## 2019-03-03 DIAGNOSIS — R944 Abnormal results of kidney function studies: Secondary | ICD-10-CM | POA: Diagnosis not present

## 2019-03-03 DIAGNOSIS — F339 Major depressive disorder, recurrent, unspecified: Secondary | ICD-10-CM | POA: Diagnosis not present

## 2019-03-03 DIAGNOSIS — R7303 Prediabetes: Secondary | ICD-10-CM | POA: Diagnosis not present

## 2019-03-03 DIAGNOSIS — E7849 Other hyperlipidemia: Secondary | ICD-10-CM | POA: Diagnosis not present

## 2019-03-03 DIAGNOSIS — R600 Localized edema: Secondary | ICD-10-CM | POA: Diagnosis not present

## 2019-03-14 DIAGNOSIS — R7303 Prediabetes: Secondary | ICD-10-CM | POA: Diagnosis not present

## 2019-03-14 DIAGNOSIS — R600 Localized edema: Secondary | ICD-10-CM | POA: Diagnosis not present

## 2019-03-14 DIAGNOSIS — R944 Abnormal results of kidney function studies: Secondary | ICD-10-CM | POA: Diagnosis not present

## 2019-03-14 DIAGNOSIS — I10 Essential (primary) hypertension: Secondary | ICD-10-CM | POA: Diagnosis not present

## 2019-03-14 DIAGNOSIS — J449 Chronic obstructive pulmonary disease, unspecified: Secondary | ICD-10-CM | POA: Diagnosis not present

## 2019-03-14 DIAGNOSIS — F339 Major depressive disorder, recurrent, unspecified: Secondary | ICD-10-CM | POA: Diagnosis not present

## 2019-03-14 DIAGNOSIS — E7849 Other hyperlipidemia: Secondary | ICD-10-CM | POA: Diagnosis not present

## 2019-03-27 ENCOUNTER — Telehealth: Payer: Self-pay

## 2019-03-27 NOTE — Telephone Encounter (Signed)
Pt called to schedule an appt with a provider. Pt hasn't been seen in 5 years. I'm not sure if pt needs a new referral to schedule apt. [phone].

## 2019-03-28 NOTE — Telephone Encounter (Signed)
CALLED PATIENT AND L/M TO CALL HERE  FOR APPOINTMENT.  WE WILL PROBABLY ASK HER TO GET HER PCP TO SEND [place] RECENT OFFICE NOTES

## 2019-03-28 NOTE — Telephone Encounter (Signed)
Noted  

## 2019-05-11 DIAGNOSIS — C44301 Unspecified malignant neoplasm of skin of nose: Secondary | ICD-10-CM | POA: Diagnosis not present

## 2019-05-11 DIAGNOSIS — F339 Major depressive disorder, recurrent, unspecified: Secondary | ICD-10-CM | POA: Diagnosis not present

## 2019-05-11 DIAGNOSIS — B3789 Other sites of candidiasis: Secondary | ICD-10-CM | POA: Diagnosis not present

## 2019-05-11 DIAGNOSIS — I10 Essential (primary) hypertension: Secondary | ICD-10-CM | POA: Diagnosis not present

## 2019-05-11 DIAGNOSIS — K219 Gastro-esophageal reflux disease without esophagitis: Secondary | ICD-10-CM | POA: Diagnosis not present

## 2019-05-11 DIAGNOSIS — F4321 Adjustment disorder with depressed mood: Secondary | ICD-10-CM | POA: Diagnosis not present

## 2019-05-11 DIAGNOSIS — E782 Mixed hyperlipidemia: Secondary | ICD-10-CM | POA: Diagnosis not present

## 2019-05-11 DIAGNOSIS — J449 Chronic obstructive pulmonary disease, unspecified: Secondary | ICD-10-CM | POA: Diagnosis not present

## 2019-05-11 DIAGNOSIS — D179 Benign lipomatous neoplasm, unspecified: Secondary | ICD-10-CM | POA: Diagnosis not present

## 2019-05-11 DIAGNOSIS — E7849 Other hyperlipidemia: Secondary | ICD-10-CM | POA: Diagnosis not present

## 2019-05-17 DIAGNOSIS — I10 Essential (primary) hypertension: Secondary | ICD-10-CM | POA: Diagnosis not present

## 2019-05-17 DIAGNOSIS — R42 Dizziness and giddiness: Secondary | ICD-10-CM | POA: Diagnosis not present

## 2019-05-17 DIAGNOSIS — R252 Cramp and spasm: Secondary | ICD-10-CM | POA: Diagnosis not present

## 2019-05-17 DIAGNOSIS — F339 Major depressive disorder, recurrent, unspecified: Secondary | ICD-10-CM | POA: Diagnosis not present

## 2019-05-17 DIAGNOSIS — E782 Mixed hyperlipidemia: Secondary | ICD-10-CM | POA: Diagnosis not present

## 2019-05-17 DIAGNOSIS — R7303 Prediabetes: Secondary | ICD-10-CM | POA: Diagnosis not present

## 2019-05-17 DIAGNOSIS — R7301 Impaired fasting glucose: Secondary | ICD-10-CM | POA: Diagnosis not present

## 2019-05-17 DIAGNOSIS — R945 Abnormal results of liver function studies: Secondary | ICD-10-CM | POA: Diagnosis not present

## 2019-05-17 DIAGNOSIS — Z6841 Body Mass Index (BMI) 40.0 and over, adult: Secondary | ICD-10-CM | POA: Diagnosis not present

## 2019-06-02 DIAGNOSIS — E7849 Other hyperlipidemia: Secondary | ICD-10-CM | POA: Diagnosis not present

## 2019-06-02 DIAGNOSIS — R944 Abnormal results of kidney function studies: Secondary | ICD-10-CM | POA: Diagnosis not present

## 2019-06-02 DIAGNOSIS — J449 Chronic obstructive pulmonary disease, unspecified: Secondary | ICD-10-CM | POA: Diagnosis not present

## 2019-06-02 DIAGNOSIS — R7303 Prediabetes: Secondary | ICD-10-CM | POA: Diagnosis not present

## 2019-06-02 DIAGNOSIS — R600 Localized edema: Secondary | ICD-10-CM | POA: Diagnosis not present

## 2019-06-02 DIAGNOSIS — I10 Essential (primary) hypertension: Secondary | ICD-10-CM | POA: Diagnosis not present

## 2019-06-02 DIAGNOSIS — F339 Major depressive disorder, recurrent, unspecified: Secondary | ICD-10-CM | POA: Diagnosis not present

## 2019-06-28 DIAGNOSIS — I10 Essential (primary) hypertension: Secondary | ICD-10-CM | POA: Diagnosis not present

## 2019-06-28 DIAGNOSIS — R944 Abnormal results of kidney function studies: Secondary | ICD-10-CM | POA: Diagnosis not present

## 2019-06-28 DIAGNOSIS — E7849 Other hyperlipidemia: Secondary | ICD-10-CM | POA: Diagnosis not present

## 2019-06-28 DIAGNOSIS — R600 Localized edema: Secondary | ICD-10-CM | POA: Diagnosis not present

## 2019-06-28 DIAGNOSIS — R7303 Prediabetes: Secondary | ICD-10-CM | POA: Diagnosis not present

## 2019-06-28 DIAGNOSIS — F339 Major depressive disorder, recurrent, unspecified: Secondary | ICD-10-CM | POA: Diagnosis not present

## 2019-06-28 DIAGNOSIS — J449 Chronic obstructive pulmonary disease, unspecified: Secondary | ICD-10-CM | POA: Diagnosis not present

## 2019-08-09 DIAGNOSIS — I10 Essential (primary) hypertension: Secondary | ICD-10-CM | POA: Diagnosis not present

## 2019-08-09 DIAGNOSIS — J449 Chronic obstructive pulmonary disease, unspecified: Secondary | ICD-10-CM | POA: Diagnosis not present

## 2019-08-09 DIAGNOSIS — E7849 Other hyperlipidemia: Secondary | ICD-10-CM | POA: Diagnosis not present

## 2019-08-09 DIAGNOSIS — R600 Localized edema: Secondary | ICD-10-CM | POA: Diagnosis not present

## 2019-08-09 DIAGNOSIS — R7303 Prediabetes: Secondary | ICD-10-CM | POA: Diagnosis not present

## 2019-08-09 DIAGNOSIS — F339 Major depressive disorder, recurrent, unspecified: Secondary | ICD-10-CM | POA: Diagnosis not present

## 2019-08-09 DIAGNOSIS — R944 Abnormal results of kidney function studies: Secondary | ICD-10-CM | POA: Diagnosis not present

## 2019-09-04 DIAGNOSIS — E7849 Other hyperlipidemia: Secondary | ICD-10-CM | POA: Diagnosis not present

## 2019-09-04 DIAGNOSIS — R944 Abnormal results of kidney function studies: Secondary | ICD-10-CM | POA: Diagnosis not present

## 2019-09-04 DIAGNOSIS — F339 Major depressive disorder, recurrent, unspecified: Secondary | ICD-10-CM | POA: Diagnosis not present

## 2019-09-04 DIAGNOSIS — R600 Localized edema: Secondary | ICD-10-CM | POA: Diagnosis not present

## 2019-09-04 DIAGNOSIS — J449 Chronic obstructive pulmonary disease, unspecified: Secondary | ICD-10-CM | POA: Diagnosis not present

## 2019-09-04 DIAGNOSIS — I10 Essential (primary) hypertension: Secondary | ICD-10-CM | POA: Diagnosis not present

## 2019-09-04 DIAGNOSIS — R7303 Prediabetes: Secondary | ICD-10-CM | POA: Diagnosis not present

## 2019-09-05 ENCOUNTER — Other Ambulatory Visit: Payer: Self-pay

## 2019-09-27 DIAGNOSIS — R600 Localized edema: Secondary | ICD-10-CM | POA: Diagnosis not present

## 2019-09-27 DIAGNOSIS — R7303 Prediabetes: Secondary | ICD-10-CM | POA: Diagnosis not present

## 2019-09-27 DIAGNOSIS — F339 Major depressive disorder, recurrent, unspecified: Secondary | ICD-10-CM | POA: Diagnosis not present

## 2019-09-27 DIAGNOSIS — J449 Chronic obstructive pulmonary disease, unspecified: Secondary | ICD-10-CM | POA: Diagnosis not present

## 2019-09-27 DIAGNOSIS — R944 Abnormal results of kidney function studies: Secondary | ICD-10-CM | POA: Diagnosis not present

## 2019-09-27 DIAGNOSIS — I10 Essential (primary) hypertension: Secondary | ICD-10-CM | POA: Diagnosis not present

## 2019-09-27 DIAGNOSIS — E7849 Other hyperlipidemia: Secondary | ICD-10-CM | POA: Diagnosis not present

## 2019-09-28 ENCOUNTER — Ambulatory Visit (HOSPITAL_COMMUNITY)
Admission: EM | Admit: 2019-09-28 | Discharge: 2019-09-28 | Disposition: A | Discharge status: Home / Self Care | Payer: PPO | Attending: Physician Assistant | Admitting: Physician Assistant

## 2019-09-28 ENCOUNTER — Encounter (HOSPITAL_COMMUNITY): Payer: Self-pay

## 2019-09-28 ENCOUNTER — Other Ambulatory Visit: Payer: Self-pay

## 2019-09-28 ENCOUNTER — Ambulatory Visit (INDEPENDENT_AMBULATORY_CARE_PROVIDER_SITE_OTHER): Payer: PPO

## 2019-09-28 DIAGNOSIS — S8992XA Unspecified injury of left lower leg, initial encounter: Secondary | ICD-10-CM | POA: Diagnosis not present

## 2019-09-28 DIAGNOSIS — S8002XA Contusion of left knee, initial encounter: Secondary | ICD-10-CM | POA: Diagnosis not present

## 2019-09-28 DIAGNOSIS — M25462 Effusion, left knee: Secondary | ICD-10-CM | POA: Diagnosis not present

## 2019-09-28 DIAGNOSIS — M1710 Unilateral primary osteoarthritis, unspecified knee: Secondary | ICD-10-CM

## 2019-09-28 DIAGNOSIS — W19XXXA Unspecified fall, initial encounter: Secondary | ICD-10-CM | POA: Diagnosis not present

## 2019-09-28 MED ORDER — DICLOFENAC SODIUM 1 % EX GEL: 2.0000 g | Freq: Four times a day (QID) | CUTANEOUS | 0 refills | Status: DC

## 2019-09-28 MED ORDER — ACETAMINOPHEN 325 MG PO TABS: 650.0000 mg | ORAL_TABLET | Freq: Four times a day (QID) | ORAL | 0 refills | Status: DC | PRN

## 2019-09-28 NOTE — ED Triage Notes (Signed)
Pt c/o pain and bruising to left knee after trip and fall on Tuesday landing on concrete sidewalk.
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
## 2019-09-28 NOTE — Discharge Instructions (Signed)
There are no broken bones today  Take tylenol as prescribed Use the voltaren gel 4 times a day  Follow up with the sports medicine center and your Primary care provider

## 2019-10-02 ENCOUNTER — Encounter: Payer: Self-pay | Admitting: Family Medicine

## 2019-10-02 ENCOUNTER — Ambulatory Visit: Payer: PPO | Admitting: Family Medicine

## 2019-10-02 ENCOUNTER — Other Ambulatory Visit: Payer: Self-pay

## 2019-10-02 VITALS — BP 154/64 | Ht 64.5 in | Wt 280.0 lb

## 2019-10-02 DIAGNOSIS — S8992XA Unspecified injury of left lower leg, initial encounter: Secondary | ICD-10-CM | POA: Diagnosis not present

## 2019-10-02 NOTE — Progress Notes (Addendum)
PCP: Celene Squibb, MD  Subjective:   HPI: Patient is a 69 y.o. female here for left knee pain.  Beverly Young states that approximately 4 days ago, she was walking on the ground level floor, when she tripped on a piece of carpet that was curled upwards. She fell directly forward onto her left knee; denies any twisting or turning motions. Prior to the fall, Beverly Young denies any dizziness, palpitations, chest pain, shortness of breath. After the fall, she denies any head trauma, loss of consciousness, dizziness, headaches. 1 day after the fall, she went to urgent care where she had x-rays that were negative for any fractures. Since that time she has noticed significant increase in the bruising and swelling of her lower leg particularly involving the mid lower leg and ankle. She denies any difficulty with motion of the left knee, or left ankle. She has noticed some numbness where she hit the concrete but denies any other numbness or tingling.  Beverly Young states that she has been using the diclofenac cream and Tylenol provided by the urgent care without significant relief in symptoms. She notes that the pain is bearable though and is not requiring any extra pain control at this time.  Past Medical History:  Diagnosis Date  . GERD (gastroesophageal reflux disease)   . 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 Hemorrhoids   . HTN (hypertension)   . Obesity   . Peptic stricture of esophagus    last dilated 07/2007    Current Outpatient Medications on File Prior to Visit  Medication Sig Dispense Refill  . acetaminophen (TYLENOL) 325 MG tablet Take 2 tablets (650 mg total) by mouth every 6 (six) hours as needed. 30 tablet 0  . amLODipine (NORVASC) 5 MG tablet Take 5 mg by mouth daily.     Marland Kitchen atorvastatin (LIPITOR) 40 MG tablet Take 40 mg by mouth at bedtime.    . Calcium Carbonate-Vitamin D (CALCIUM-VITAMIN D) 500-200 MG-UNIT per tablet Take 1 tablet by mouth daily.      . diclofenac Sodium (VOLTAREN) 1 %  GEL Apply 2 g topically 4 (four) times daily. 100 g 0  . escitalopram (LEXAPRO) 5 MG tablet Take 2 tablets by mouth daily.     . Glucosamine Sulfate 1000 MG CAPS Take 1 capsule by mouth daily.      . hydrochlorothiazide (HYDRODIURIL) 12.5 MG tablet Take 12.5 mg by mouth every morning.    . lovastatin (MEVACOR) 20 MG tablet Take 20 mg by mouth at bedtime.    . metoprolol succinate (TOPROL-XL) 50 MG 24 hr tablet Take 1 tablet by mouth daily.    . multivitamin-iron-minerals-folic acid (CENTRUM) chewable tablet Chew 1 tablet by mouth daily.      Marland Kitchen olmesartan (BENICAR) 40 MG tablet Take 40 mg by mouth every morning.    . olmesartan-hydrochlorothiazide (BENICAR HCT) 40-12.5 MG tablet Take 1 tablet by mouth daily.    Marland Kitchen omeprazole (PRILOSEC) 20 MG capsule Take 1 capsule (20 mg total) by mouth daily. 30 capsule 3  . Potassium Gluconate 550 MG TABS Take 1 tablet by mouth daily.     No current facility-administered medications on file prior to visit.    Past Surgical History:  Procedure Laterality Date  . CESAREAN SECTION    . COLONOSCOPY  07/11/07   external hemorrhoidal otherwise normal rectum pancolonic diverticula, suboptimal prep on the right (Next 07/2012)  . ESOPHAGOGASTRODUODENOSCOPY  07/11/07   circumferential distal esophageal ersionc/soft peptic ring dilated 61F/small hiatal herina  .  KNEE SURGERY     right  . PARTIAL HYSTERECTOMY      Allergies  Allergen Reactions  . Codeine Other (See Comments)    Make her feel crazy    Social History   Socioeconomic History  . Marital status: Widowed    Spouse name: Not on file  . Number of children: Not on file  . Years of education: Not on file  . Highest education level: Not on file  Occupational History  . Not on file  Tobacco Use  . Smoking status: Never Smoker  . Smokeless tobacco: Never Used  Vaping Use  . Vaping Use: Never used  Substance and Sexual Activity  . Alcohol use: No  . Drug use: No  . Sexual activity: Not on file   Other Topics Concern  . Not on file  Social History Narrative  . Not on file   Social Determinants of Health   Financial Resource Strain:   . Difficulty of Paying Living Expenses: Not on file  Food Insecurity:   . Worried About Charity fundraiser in the Last Year: Not on file  . Ran Out of Food in the Last Year: Not on file  Transportation Needs:   . Lack of Transportation (Medical): Not on file  . Lack of Transportation (Non-Medical): Not on file  Physical Activity:   . Days of Exercise per Week: Not on file  . Minutes of Exercise per Session: Not on file  Stress:   . Feeling of Stress : Not on file  Social Connections:   . Frequency of Communication with Friends and Family: Not on file  . Frequency of Social Gatherings with Friends and Family: Not on file  . Attends Religious Services: Not on file  . Active Member of Clubs or Organizations: Not on file  . Attends Archivist Meetings: Not on file  . Marital Status: Not on file  Intimate Partner Violence:   . Fear of Current or Ex-Partner: Not on file  . Emotionally Abused: Not on file  . Physically Abused: Not on file  . Sexually Abused: Not on file    Family History  Problem Relation Age of Onset  . Colon cancer Father 22  . Colon polyps Sister   . Liver cancer Paternal Aunt     BP (!) 154/64   Ht 5' 4.5" (1.638 m)   Wt 280 lb (127 kg)   BMI 47.32 kg/m   Review of Systems: See HPI above.     Objective:  Physical Exam:  Gen: NAD, comfortable in exam room  Left knee: Ecchymosis throughout anterior lower leg below knee into dorsal foot and heel.  Soft tissue swelling.  No obvious effusion. No joint line tenderness.  Tenderness throughout ecchymotic areas. FROM with 5/5 strength. Negative ant/post drawers. Negative valgus/varus testing. Negative lachmans. NV intact distally.  Left ankle: Swelling, ecchymoses as noted above. FROM with 5/5 strength. No TTP malleoli, base 5th,  navicular. Negative ant drawer and talar tilt.   Thompsons test negative. NV intact distally.  Limited MSK u/s left lower extremity:  Soft tissue swelling throughout lower leg.  No filling defects in superficial veins or popliteal vein; all structures compressible.   Assessment & Plan:  1. Left knee injury:  Independently reviewed radiographs and no evidence fracture.  Consistent with severe knee contusion.  Ultrasound reassuring.  Discussed election, icing.  Tylenol as needed.  Compression when she can tolerate this.  Stressed importance of motion exercises of ankle.  F/u in 1 week.

## 2019-10-02 NOTE — Patient Instructions (Signed)
You have a severe knee contusion. The swelling/bruising from this is going down your leg causing the swelling, bruising, discomfort down here. It's very important you do ankle motion exercises several times a day. Icing 15 minutes at a time 3-4 times a day. Elevate the leg above your heart when possible. Compression with an ACE wrap would be helpful but it may be too painful to do this right now - we'll discuss again in a week. Tylenol as you have been - let me know if you need something stronger. Out of work Midwife, can return tomorrow. Follow up with me in 1 week.

## 2019-10-09 ENCOUNTER — Other Ambulatory Visit: Payer: Self-pay

## 2019-10-09 ENCOUNTER — Ambulatory Visit: Payer: PPO | Admitting: Family Medicine

## 2019-10-09 ENCOUNTER — Encounter: Payer: Self-pay | Admitting: Family Medicine

## 2019-10-09 VITALS — BP 131/67 | Ht 64.5 in | Wt 275.0 lb

## 2019-10-09 DIAGNOSIS — S8992XD Unspecified injury of left lower leg, subsequent encounter: Secondary | ICD-10-CM | POA: Diagnosis not present

## 2019-10-09 NOTE — Patient Instructions (Signed)
This is improving as expected. The bruising usually takes another 2 weeks to resolve, swelling about 4-6 weeks I'd expect and the numbness will slowly resolve after this. Tylenol, icing if needed. Elevate above your heart as much as possible. Compression stocking may help with the swelling if you can tolerate this now. Call me if you have any problems otherwise follow up as needed.

## 2019-10-09 NOTE — Progress Notes (Signed)
PCP: Celene Squibb, MD  Subjective:   HPI: Patient is a 69 y.o. female here for left knee injury.  8/23: Beverly Young states that approximately 4 days ago, she was walking on the ground level floor, when she tripped on a piece of carpet that was curled upwards. She fell directly forward onto her left knee; denies any twisting or turning motions. Prior to the fall, Beverly Young denies any dizziness, palpitations, chest pain, shortness of breath. After the fall, she denies any head trauma, loss of consciousness, dizziness, headaches. 1 day after the fall, she went to urgent care where she had x-rays that were negative for any fractures. Since that time she has noticed significant increase in the bruising and swelling of her lower leg particularly involving the mid lower leg and ankle. She denies any difficulty with motion of the left knee, or left ankle. She has noticed some numbness where she hit the concrete but denies any other numbness or tingling.  Beverly Young states that she has been using the diclofenac cream and Tylenol provided by the urgent care without significant relief in symptoms. She notes that the pain is bearable though and is not requiring any extra pain control at this time.  8/30: Patient reports she's slowly improving. Bruising, swelling improved though notices more swelling after she's done with work. Able to ambulate ok. Small amount of redness anterior knee but no fever. Localized numbness anterior knee in area where she fell. Not taking anything for pain.  Past Medical History:  Diagnosis Date  . GERD (gastroesophageal reflux disease)   . 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 Hemorrhoids   . HTN (hypertension)   . Obesity   . Peptic stricture of esophagus    last dilated 07/2007    Current Outpatient Medications on File Prior to Visit  Medication Sig Dispense Refill  . acetaminophen (TYLENOL) 325 MG tablet Take 2 tablets (650 mg total) by mouth every 6 (six) hours as  needed. 30 tablet 0  . amLODipine (NORVASC) 5 MG tablet Take 5 mg by mouth daily.     Marland Kitchen atorvastatin (LIPITOR) 40 MG tablet Take 40 mg by mouth at bedtime.    . Calcium Carbonate-Vitamin D (CALCIUM-VITAMIN D) 500-200 MG-UNIT per tablet Take 1 tablet by mouth daily.      . diclofenac Sodium (VOLTAREN) 1 % GEL Apply 2 g topically 4 (four) times daily. 100 g 0  . escitalopram (LEXAPRO) 5 MG tablet Take 2 tablets by mouth daily.     . Glucosamine Sulfate 1000 MG CAPS Take 1 capsule by mouth daily.      . hydrochlorothiazide (HYDRODIURIL) 12.5 MG tablet Take 12.5 mg by mouth every morning.    . lovastatin (MEVACOR) 20 MG tablet Take 20 mg by mouth at bedtime.    . metoprolol succinate (TOPROL-XL) 50 MG 24 hr tablet Take 1 tablet by mouth daily.    . multivitamin-iron-minerals-folic acid (CENTRUM) chewable tablet Chew 1 tablet by mouth daily.      Marland Kitchen olmesartan (BENICAR) 40 MG tablet Take 40 mg by mouth every morning.    . olmesartan-hydrochlorothiazide (BENICAR HCT) 40-12.5 MG tablet Take 1 tablet by mouth daily.    Marland Kitchen omeprazole (PRILOSEC) 20 MG capsule Take 1 capsule (20 mg total) by mouth daily. 30 capsule 3  . Potassium Gluconate 550 MG TABS Take 1 tablet by mouth daily.     No current facility-administered medications on file prior to visit.    Past Surgical History:  Procedure Laterality Date  . CESAREAN SECTION    . COLONOSCOPY  07/11/07   external hemorrhoidal otherwise normal rectum pancolonic diverticula, suboptimal prep on the right (Next 07/2012)  . ESOPHAGOGASTRODUODENOSCOPY  07/11/07   circumferential distal esophageal ersionc/soft peptic ring dilated 26F/small hiatal herina  . KNEE SURGERY     right  . PARTIAL HYSTERECTOMY      Allergies  Allergen Reactions  . Codeine Other (See Comments)    Make her feel crazy    Social History   Socioeconomic History  . Marital status: Widowed    Spouse name: Not on file  . Number of children: Not on file  . Years of education: Not  on file  . Highest education level: Not on file  Occupational History  . Not on file  Tobacco Use  . Smoking status: Never Smoker  . Smokeless tobacco: Never Used  Vaping Use  . Vaping Use: Never used  Substance and Sexual Activity  . Alcohol use: No  . Drug use: No  . Sexual activity: Not on file  Other Topics Concern  . Not on file  Social History Narrative  . Not on file   Social Determinants of Health   Financial Resource Strain:   . Difficulty of Paying Living Expenses: Not on file  Food Insecurity:   . Worried About Charity fundraiser in the Last Year: Not on file  . Ran Out of Food in the Last Year: Not on file  Transportation Needs:   . Lack of Transportation (Medical): Not on file  . Lack of Transportation (Non-Medical): Not on file  Physical Activity:   . Days of Exercise per Week: Not on file  . Minutes of Exercise per Session: Not on file  Stress:   . Feeling of Stress : Not on file  Social Connections:   . Frequency of Communication with Friends and Family: Not on file  . Frequency of Social Gatherings with Friends and Family: Not on file  . Attends Religious Services: Not on file  . Active Member of Clubs or Organizations: Not on file  . Attends Archivist Meetings: Not on file  . Marital Status: Not on file  Intimate Partner Violence:   . Fear of Current or Ex-Partner: Not on file  . Emotionally Abused: Not on file  . Physically Abused: Not on file  . Sexually Abused: Not on file    Family History  Problem Relation Age of Onset  . Colon cancer Father 44  . Colon polyps Sister   . Liver cancer Paternal Aunt     BP 131/67   Ht 5' 4.5" (1.638 m)   Wt 275 lb (124.7 kg)   BMI 46.47 kg/m   Review of Systems: See HPI above.     Objective:  Physical Exam:  Gen: NAD, comfortable in exam room  Left knee: 2+ pitting edema lower leg (slightly less on right).  Minimal redness over tibial tubercle.  Varicosities around ankle and heel.   No other gross deformity, ecchymoses, swelling. Mild TTP over proximal tibia.  No joint line, other tenderness. ROM 0 - 100 degrees with 5/5 strength flexion and extension. Negative ant/post drawers. Negative valgus/varus testing. Negative lachmans. Negative mcmurrays, apleys. NV intact distally.   Assessment & Plan:  1. Left knee injury - 2/2 contusion.  Improving.  Icing, elevation, tylenol if needed.  Compression stocking if tolerated at this point.  Reassured.  F/u prn.

## 2019-10-27 DIAGNOSIS — J449 Chronic obstructive pulmonary disease, unspecified: Secondary | ICD-10-CM | POA: Diagnosis not present

## 2019-10-27 DIAGNOSIS — R600 Localized edema: Secondary | ICD-10-CM | POA: Diagnosis not present

## 2019-10-27 DIAGNOSIS — R7303 Prediabetes: Secondary | ICD-10-CM | POA: Diagnosis not present

## 2019-10-27 DIAGNOSIS — E7849 Other hyperlipidemia: Secondary | ICD-10-CM | POA: Diagnosis not present

## 2019-10-27 DIAGNOSIS — R944 Abnormal results of kidney function studies: Secondary | ICD-10-CM | POA: Diagnosis not present

## 2019-10-27 DIAGNOSIS — I10 Essential (primary) hypertension: Secondary | ICD-10-CM | POA: Diagnosis not present

## 2019-10-27 DIAGNOSIS — F339 Major depressive disorder, recurrent, unspecified: Secondary | ICD-10-CM | POA: Diagnosis not present

## 2019-11-29 DIAGNOSIS — J449 Chronic obstructive pulmonary disease, unspecified: Secondary | ICD-10-CM | POA: Diagnosis not present

## 2019-11-29 DIAGNOSIS — I10 Essential (primary) hypertension: Secondary | ICD-10-CM | POA: Diagnosis not present

## 2019-11-29 DIAGNOSIS — R944 Abnormal results of kidney function studies: Secondary | ICD-10-CM | POA: Diagnosis not present

## 2019-11-29 DIAGNOSIS — F339 Major depressive disorder, recurrent, unspecified: Secondary | ICD-10-CM | POA: Diagnosis not present

## 2019-11-29 DIAGNOSIS — E7849 Other hyperlipidemia: Secondary | ICD-10-CM | POA: Diagnosis not present

## 2019-11-29 DIAGNOSIS — R7303 Prediabetes: Secondary | ICD-10-CM | POA: Diagnosis not present

## 2019-11-29 DIAGNOSIS — R600 Localized edema: Secondary | ICD-10-CM | POA: Diagnosis not present

## 2019-12-08 DIAGNOSIS — R079 Chest pain, unspecified: Secondary | ICD-10-CM | POA: Diagnosis not present

## 2019-12-08 DIAGNOSIS — Z6841 Body Mass Index (BMI) 40.0 and over, adult: Secondary | ICD-10-CM | POA: Diagnosis not present

## 2019-12-08 DIAGNOSIS — E782 Mixed hyperlipidemia: Secondary | ICD-10-CM | POA: Diagnosis not present

## 2019-12-08 DIAGNOSIS — F4321 Adjustment disorder with depressed mood: Secondary | ICD-10-CM | POA: Diagnosis not present

## 2019-12-08 DIAGNOSIS — K219 Gastro-esophageal reflux disease without esophagitis: Secondary | ICD-10-CM | POA: Diagnosis not present

## 2019-12-08 DIAGNOSIS — R7301 Impaired fasting glucose: Secondary | ICD-10-CM | POA: Diagnosis not present

## 2019-12-08 DIAGNOSIS — Z0001 Encounter for general adult medical examination with abnormal findings: Secondary | ICD-10-CM | POA: Diagnosis not present

## 2019-12-08 DIAGNOSIS — R002 Palpitations: Secondary | ICD-10-CM | POA: Diagnosis not present

## 2019-12-08 DIAGNOSIS — F339 Major depressive disorder, recurrent, unspecified: Secondary | ICD-10-CM | POA: Diagnosis not present

## 2019-12-08 DIAGNOSIS — C44301 Unspecified malignant neoplasm of skin of nose: Secondary | ICD-10-CM | POA: Diagnosis not present

## 2019-12-12 DIAGNOSIS — R7303 Prediabetes: Secondary | ICD-10-CM | POA: Diagnosis not present

## 2019-12-12 DIAGNOSIS — I10 Essential (primary) hypertension: Secondary | ICD-10-CM | POA: Diagnosis not present

## 2019-12-12 DIAGNOSIS — Z0001 Encounter for general adult medical examination with abnormal findings: Secondary | ICD-10-CM | POA: Diagnosis not present

## 2019-12-12 DIAGNOSIS — Z6841 Body Mass Index (BMI) 40.0 and over, adult: Secondary | ICD-10-CM | POA: Diagnosis not present

## 2019-12-12 DIAGNOSIS — E782 Mixed hyperlipidemia: Secondary | ICD-10-CM | POA: Diagnosis not present

## 2019-12-12 DIAGNOSIS — F339 Major depressive disorder, recurrent, unspecified: Secondary | ICD-10-CM | POA: Diagnosis not present

## 2019-12-12 DIAGNOSIS — M792 Neuralgia and neuritis, unspecified: Secondary | ICD-10-CM | POA: Diagnosis not present

## 2019-12-12 DIAGNOSIS — R252 Cramp and spasm: Secondary | ICD-10-CM | POA: Diagnosis not present

## 2019-12-12 DIAGNOSIS — R945 Abnormal results of liver function studies: Secondary | ICD-10-CM | POA: Diagnosis not present

## 2019-12-12 DIAGNOSIS — R7301 Impaired fasting glucose: Secondary | ICD-10-CM | POA: Diagnosis not present

## 2019-12-21 DIAGNOSIS — H5213 Myopia, bilateral: Secondary | ICD-10-CM | POA: Diagnosis not present

## 2019-12-21 DIAGNOSIS — H2513 Age-related nuclear cataract, bilateral: Secondary | ICD-10-CM | POA: Diagnosis not present

## 2019-12-21 DIAGNOSIS — H524 Presbyopia: Secondary | ICD-10-CM | POA: Diagnosis not present

## 2019-12-29 DIAGNOSIS — K219 Gastro-esophageal reflux disease without esophagitis: Secondary | ICD-10-CM | POA: Diagnosis not present

## 2019-12-29 DIAGNOSIS — F339 Major depressive disorder, recurrent, unspecified: Secondary | ICD-10-CM | POA: Diagnosis not present

## 2019-12-29 DIAGNOSIS — E7849 Other hyperlipidemia: Secondary | ICD-10-CM | POA: Diagnosis not present

## 2019-12-29 DIAGNOSIS — I10 Essential (primary) hypertension: Secondary | ICD-10-CM | POA: Diagnosis not present

## 2020-01-18 ENCOUNTER — Ambulatory Visit: Payer: PPO | Admitting: Podiatry

## 2020-01-18 ENCOUNTER — Ambulatory Visit (INDEPENDENT_AMBULATORY_CARE_PROVIDER_SITE_OTHER): Payer: PPO

## 2020-01-18 ENCOUNTER — Other Ambulatory Visit: Payer: Self-pay

## 2020-01-18 ENCOUNTER — Encounter: Payer: Self-pay | Admitting: Podiatry

## 2020-01-18 DIAGNOSIS — S93602A Unspecified sprain of left foot, initial encounter: Secondary | ICD-10-CM

## 2020-01-18 DIAGNOSIS — M778 Other enthesopathies, not elsewhere classified: Secondary | ICD-10-CM

## 2020-01-18 MED ORDER — MELOXICAM 15 MG PO TABS: 15.0000 mg | ORAL_TABLET | Freq: Every day | ORAL | 3 refills | Status: DC

## 2020-01-18 MED ORDER — METHYLPREDNISOLONE 4 MG PO TBPK: ORAL_TABLET | ORAL | 0 refills | Status: DC

## 2020-01-18 NOTE — Progress Notes (Signed)
Subjective:  Patient ID: Beverly Young, female    DOB: 1950/09/25,  MRN: [id number] HPI Chief Complaint  Patient presents with  . Foot Pain    Dorsal/lateral foot left - aching, throbbing severely some days x 3 weeks, no injury, swelling, tried "stop pain" roll-on and tylenol PRN - works 3rd shift  . New Patient (Initial Visit)    69 y.o. female presents with the above complaint.   ROS: Denies fever chills nausea vomiting muscle aches pains calf pain back pain chest pain shortness of breath.  Past Medical History:  Diagnosis Date  . GERD (gastroesophageal reflux disease)   . 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 Hemorrhoids   . HTN (hypertension)   . Obesity   . Peptic stricture of esophagus    last dilated 07/2007   Past Surgical History:  Procedure Laterality Date  . CESAREAN SECTION    . COLONOSCOPY  07/11/07   external hemorrhoidal otherwise normal rectum pancolonic diverticula, suboptimal prep on the right (Next 07/2012)  . ESOPHAGOGASTRODUODENOSCOPY  07/11/07   circumferential distal esophageal ersionc/soft peptic ring dilated 49F/small hiatal herina  . KNEE SURGERY     right  . PARTIAL HYSTERECTOMY      Current Outpatient Medications:  .  acetaminophen (TYLENOL) 325 MG tablet, Take 2 tablets (650 mg total) by mouth every 6 (six) hours as needed., Disp: 30 tablet, Rfl: 0 .  amLODipine (NORVASC) 5 MG tablet, Take 5 mg by mouth daily. , Disp: , Rfl:  .  atorvastatin (LIPITOR) 40 MG tablet, Take 40 mg by mouth at bedtime., Disp: , Rfl:  .  Calcium Carbonate-Vitamin D (CALCIUM-VITAMIN D) 500-200 MG-UNIT per tablet, Take 1 tablet by mouth daily.  , Disp: , Rfl:  .  diclofenac Sodium (VOLTAREN) 1 % GEL, Apply 2 g topically 4 (four) times daily., Disp: 100 g, Rfl: 0 .  escitalopram (LEXAPRO) 5 MG tablet, Take 2 tablets by mouth daily. , Disp: , Rfl:  .  Glucosamine Sulfate 1000 MG CAPS, Take 1 capsule by mouth daily.  , Disp: , Rfl:  .  hydrochlorothiazide (HYDRODIURIL) 12.5 MG  tablet, Take 12.5 mg by mouth every morning., Disp: , Rfl:  .  lovastatin (MEVACOR) 20 MG tablet, Take 20 mg by mouth at bedtime., Disp: , Rfl:  .  meloxicam (MOBIC) 15 MG tablet, Take 1 tablet (15 mg total) by mouth daily., Disp: 30 tablet, Rfl: 3 .  methylPREDNISolone (MEDROL DOSEPAK) 4 MG TBPK tablet, 6 day dose pack - take as directed, Disp: 21 tablet, Rfl: 0 .  metoprolol succinate (TOPROL-XL) 50 MG 24 hr tablet, Take 1 tablet by mouth daily., Disp: , Rfl:  .  multivitamin-iron-minerals-folic acid (CENTRUM) chewable tablet, Chew 1 tablet by mouth daily.  , Disp: , Rfl:  .  naproxen (NAPROSYN) 250 MG tablet, , Disp: , Rfl:  .  olmesartan (BENICAR) 40 MG tablet, Take 40 mg by mouth every morning., Disp: , Rfl:  .  olmesartan-hydrochlorothiazide (BENICAR HCT) 40-12.5 MG tablet, Take 1 tablet by mouth daily., Disp: , Rfl:  .  omeprazole (PRILOSEC) 20 MG capsule, Take 1 capsule (20 mg total) by mouth daily., Disp: 30 capsule, Rfl: 3 .  Potassium Gluconate 550 MG TABS, Take 1 tablet by mouth daily., Disp: , Rfl:   Allergies  Allergen Reactions  . Codeine Other (See Comments)    Make her feel crazy   Review of Systems Objective:  There were no vitals filed for this visit.  General: Well developed, nourished,  in no acute distress, alert and oriented x3   Dermatological: Skin is warm, dry and supple bilateral. Nails x 10 are well maintained; remaining integument appears unremarkable at this time. There are no open sores, no preulcerative lesions, no rash or signs of infection present.  Vascular: Dorsalis Pedis artery and Posterior Tibial artery pedal pulses are 2/4 bilateral with immedate capillary fill time. Pedal hair growth present. No varicosities and no lower extremity edema present bilateral.   Neruologic: Grossly intact via light touch bilateral. Vibratory intact via tuning fork bilateral. Protective threshold with Semmes Wienstein monofilament intact to all pedal sites bilateral.  Patellar and Achilles deep tendon reflexes 2+ bilateral. No Babinski or clonus noted bilateral.   Musculoskeletal: No gross boney pedal deformities bilateral. No pain, crepitus, or limitation noted with foot and ankle range of motion bilateral. Muscular strength 5/5 in all groups tested bilateral.  She has pain on frontal plane range of motion at the fourth fifth tarsometatarsal joint and in the proximal third and fourth intermetatarsal spaces.  Gait: Unassisted, Nonantalgic.    Radiographs:  Radiographs taken today demonstrate an osseously mature foot very large calcification within the Achilles.  Retrocalcaneal heel spurs I was also noted.  Thickening of the Achilles is noted forefoot edema but no acute findings.  Assessment & Plan:   Assessment: She has a sprain of the midfoot at the third and fourth intermetatarsal spaces proximally.  Plan: Offered her an injection she declined.  Started her on Medrol Dosepak to be followed by meloxicam.  Placed her in a short cam boot.  Follow-up with her in 1 month     Beverly Young, [place]

## 2020-01-19 ENCOUNTER — Other Ambulatory Visit: Payer: Self-pay | Admitting: Podiatry

## 2020-01-19 DIAGNOSIS — S93602A Unspecified sprain of left foot, initial encounter: Secondary | ICD-10-CM

## 2020-02-06 DIAGNOSIS — Z23 Encounter for immunization: Secondary | ICD-10-CM | POA: Diagnosis not present

## 2020-02-09 DIAGNOSIS — K219 Gastro-esophageal reflux disease without esophagitis: Secondary | ICD-10-CM | POA: Diagnosis not present

## 2020-02-09 DIAGNOSIS — E7849 Other hyperlipidemia: Secondary | ICD-10-CM | POA: Diagnosis not present

## 2020-02-09 DIAGNOSIS — F339 Major depressive disorder, recurrent, unspecified: Secondary | ICD-10-CM | POA: Diagnosis not present

## 2020-02-09 DIAGNOSIS — I10 Essential (primary) hypertension: Secondary | ICD-10-CM | POA: Diagnosis not present

## 2020-02-20 ENCOUNTER — Ambulatory Visit: Payer: PPO | Admitting: Podiatry

## 2020-02-20 ENCOUNTER — Encounter: Payer: Self-pay | Admitting: Podiatry

## 2020-02-20 ENCOUNTER — Other Ambulatory Visit: Payer: Self-pay

## 2020-02-20 DIAGNOSIS — S93602D Unspecified sprain of left foot, subsequent encounter: Secondary | ICD-10-CM | POA: Diagnosis not present

## 2020-02-21 NOTE — Progress Notes (Signed)
She presents today for follow-up of her sprain to the lateral aspect of her foot fourth fifth met area.  She states it seems to be doing well really well and she continues to wear her boot.  She continues to take her meloxicam.  Objective: Vital signs are stable she is alert and oriented x3.  Pulses are palpable.  He still has pain on palpation to the proximal fourth intermetatarsal space.  Assessment: 95% reduction patient's symptoms sprain fourth intermetatarsal space proximally.  Plan: Continue use of the boot for another 3 to 4 weeks and continue meloxicam.  Follow-up with me in 1 month if necessary.

## 2020-03-08 DIAGNOSIS — K219 Gastro-esophageal reflux disease without esophagitis: Secondary | ICD-10-CM | POA: Diagnosis not present

## 2020-03-08 DIAGNOSIS — R7301 Impaired fasting glucose: Secondary | ICD-10-CM | POA: Diagnosis not present

## 2020-03-08 DIAGNOSIS — E782 Mixed hyperlipidemia: Secondary | ICD-10-CM | POA: Diagnosis not present

## 2020-03-08 DIAGNOSIS — R002 Palpitations: Secondary | ICD-10-CM | POA: Diagnosis not present

## 2020-03-08 DIAGNOSIS — C44301 Unspecified malignant neoplasm of skin of nose: Secondary | ICD-10-CM | POA: Diagnosis not present

## 2020-03-08 DIAGNOSIS — F4321 Adjustment disorder with depressed mood: Secondary | ICD-10-CM | POA: Diagnosis not present

## 2020-03-08 DIAGNOSIS — J069 Acute upper respiratory infection, unspecified: Secondary | ICD-10-CM | POA: Diagnosis not present

## 2020-03-08 DIAGNOSIS — Z0001 Encounter for general adult medical examination with abnormal findings: Secondary | ICD-10-CM | POA: Diagnosis not present

## 2020-03-08 DIAGNOSIS — R079 Chest pain, unspecified: Secondary | ICD-10-CM | POA: Diagnosis not present

## 2020-03-08 DIAGNOSIS — F339 Major depressive disorder, recurrent, unspecified: Secondary | ICD-10-CM | POA: Diagnosis not present

## 2020-03-09 DIAGNOSIS — R42 Dizziness and giddiness: Secondary | ICD-10-CM | POA: Diagnosis not present

## 2020-03-09 DIAGNOSIS — Z23 Encounter for immunization: Secondary | ICD-10-CM | POA: Diagnosis not present

## 2020-03-09 DIAGNOSIS — I10 Essential (primary) hypertension: Secondary | ICD-10-CM | POA: Diagnosis not present

## 2020-03-09 DIAGNOSIS — E782 Mixed hyperlipidemia: Secondary | ICD-10-CM | POA: Diagnosis not present

## 2020-03-09 DIAGNOSIS — F339 Major depressive disorder, recurrent, unspecified: Secondary | ICD-10-CM | POA: Diagnosis not present

## 2020-03-19 ENCOUNTER — Other Ambulatory Visit: Payer: Self-pay

## 2020-03-19 ENCOUNTER — Ambulatory Visit: Payer: PPO | Admitting: Podiatry

## 2020-03-19 DIAGNOSIS — S93602D Unspecified sprain of left foot, subsequent encounter: Secondary | ICD-10-CM

## 2020-03-19 NOTE — Progress Notes (Signed)
She presents today for follow-up of her left foot sprain to the fourth intermetatarsal space.  States that there is great improvement.  Continues to wear her cam boot.  Objective: Vital signs are stable she is alert oriented x3 cam boot appears to be falling apart.  She has no swelling no erythema cellulitis drainage or odor on physical exam.  No pain on frontal plane range of motion no pain on direct palpation of the proximal most aspect of the metatarsal intermetatarsal space.  Assessment: Well-healing sprained ligament fourth intermetatarsal space left.  Plan: We will allow her to get back into her regular shoes and I will follow-up with her in a few weeks if needed.

## 2020-04-08 DIAGNOSIS — F339 Major depressive disorder, recurrent, unspecified: Secondary | ICD-10-CM | POA: Diagnosis not present

## 2020-04-08 DIAGNOSIS — I10 Essential (primary) hypertension: Secondary | ICD-10-CM | POA: Diagnosis not present

## 2020-04-08 DIAGNOSIS — K219 Gastro-esophageal reflux disease without esophagitis: Secondary | ICD-10-CM | POA: Diagnosis not present

## 2020-04-08 DIAGNOSIS — E7849 Other hyperlipidemia: Secondary | ICD-10-CM | POA: Diagnosis not present

## 2020-04-16 ENCOUNTER — Ambulatory Visit: Payer: PPO | Admitting: Podiatry

## 2020-04-16 ENCOUNTER — Other Ambulatory Visit: Payer: Self-pay

## 2020-04-16 ENCOUNTER — Encounter: Payer: Self-pay | Admitting: Podiatry

## 2020-04-16 ENCOUNTER — Telehealth: Payer: Self-pay | Admitting: *Deleted

## 2020-04-16 ENCOUNTER — Ambulatory Visit (INDEPENDENT_AMBULATORY_CARE_PROVIDER_SITE_OTHER): Payer: PPO

## 2020-04-16 DIAGNOSIS — M722 Plantar fascial fibromatosis: Secondary | ICD-10-CM | POA: Diagnosis not present

## 2020-04-16 DIAGNOSIS — M7661 Achilles tendinitis, right leg: Secondary | ICD-10-CM

## 2020-04-16 MED ORDER — MELOXICAM 15 MG PO TABS: 15.0000 mg | ORAL_TABLET | Freq: Every day | ORAL | 3 refills | Status: DC

## 2020-04-16 MED ORDER — METHYLPREDNISOLONE 4 MG PO TBPK: ORAL_TABLET | ORAL | 0 refills | Status: DC

## 2020-04-16 MED ORDER — TRIAMCINOLONE ACETONIDE 40 MG/ML IJ SUSP
20.0000 mg | Freq: Once | INTRAMUSCULAR | Status: AC
  Administered 2020-04-16: 20 mg

## 2020-04-16 NOTE — Progress Notes (Signed)
She presents today with chief complaint of plantar right heel pain states that she stepped up onto a step caring arm for groceries and felt a pop in her foot she says been hurting ever since then she says she cannot really bear weight on it without extreme pain.  Objective: Vital signs are stable she is alert and oriented x3.  Pulses are palpable.  She has pain on palpation plantar aspect of the heel plantar fascial appears to be weakened.  She also has tenderness on palpation of the Achilles.  Swelling about the ankle.  Radiographs taken today demonstrate considerable fluid retention in the ankle with enlarged and thickened and Achilles however and a retrocalcaneal heel spur as well as a fracture of the heel spur.  Also demonstrates a plantar distally oriented calcaneal heel spur which does not demonstrate a fracture.  In the plantar fascia appears intact  Assessment: Planter fasciitis cannot rule out a tear of the plantar fascia.  Achilles tendinitis.  Plan: I injected the plantar fashion today 20 mg Kenalog 5 mg Marcaine point of maximal tenderness.  Recommended that she use her cam walker which she has at home.  Start her on Medrol Dosepak to be followed by meloxicam.  Follow-up with her in 1 month

## 2020-04-16 NOTE — Telephone Encounter (Signed)
-----   Message from Rivka Barbara sent at 04/16/2020 12:33 PM EST ----- Regarding: Prescription Patient states that the following medications should be sent to [id number] - [place], [place] - [place]   methylPREDNISolone (MEDROL DOSEPAK) 4 MG TBPK tablet  and meloxicam (MOBIC) 15 MG tablet

## 2020-04-16 NOTE — Telephone Encounter (Signed)
Rx sent to [facility] initially, resent to [facility]

## 2020-04-18 ENCOUNTER — Ambulatory Visit: Payer: PPO | Admitting: Podiatry

## 2020-04-24 ENCOUNTER — Emergency Department (HOSPITAL_COMMUNITY): Payer: PPO

## 2020-04-24 ENCOUNTER — Emergency Department (HOSPITAL_COMMUNITY)
Admission: EM | Admit: 2020-04-24 | Discharge: 2020-04-24 | Disposition: A | Discharge status: Home / Self Care | Payer: PPO | Attending: Emergency Medicine | Admitting: Emergency Medicine

## 2020-04-24 DIAGNOSIS — I10 Essential (primary) hypertension: Secondary | ICD-10-CM | POA: Diagnosis not present

## 2020-04-24 DIAGNOSIS — R0789 Other chest pain: Secondary | ICD-10-CM | POA: Diagnosis not present

## 2020-04-24 DIAGNOSIS — G4489 Other headache syndrome: Secondary | ICD-10-CM | POA: Diagnosis not present

## 2020-04-24 DIAGNOSIS — Z79899 Other long term (current) drug therapy: Secondary | ICD-10-CM | POA: Insufficient documentation

## 2020-04-24 DIAGNOSIS — R079 Chest pain, unspecified: Secondary | ICD-10-CM | POA: Diagnosis not present

## 2020-04-24 LAB — CBC WITH DIFFERENTIAL/PLATELET
Abs Immature Granulocytes: 0.11 10*3/uL — ABNORMAL HIGH (ref 0.00–0.07)
Basophils Absolute: 0.1 10*3/uL (ref 0.0–0.1)
Basophils Relative: 1 %
Eosinophils Absolute: 0.4 10*3/uL (ref 0.0–0.5)
Eosinophils Relative: 4 %
HCT: 41.5 % (ref 36.0–46.0)
Hemoglobin: 13.6 g/dL (ref 12.0–15.0)
Immature Granulocytes: 1 %
Lymphocytes Relative: 25 %
Lymphs Abs: 2.7 10*3/uL (ref 0.7–4.0)
MCH: 29.5 pg (ref 26.0–34.0)
MCHC: 32.8 g/dL (ref 30.0–36.0)
MCV: 90 fL (ref 80.0–100.0)
Monocytes Absolute: 0.8 10*3/uL (ref 0.1–1.0)
Monocytes Relative: 7 %
Neutro Abs: 6.8 10*3/uL (ref 1.7–7.7)
Neutrophils Relative %: 62 %
Platelets: 190 10*3/uL (ref 150–400)
RBC: 4.61 MIL/uL (ref 3.87–5.11)
RDW: 13.4 % (ref 11.5–15.5)
WBC: 10.9 10*3/uL — ABNORMAL HIGH (ref 4.0–10.5)
nRBC: 0 % (ref 0.0–0.2)

## 2020-04-24 LAB — BASIC METABOLIC PANEL
Anion gap: 9 (ref 5–15)
BUN: 26 mg/dL — ABNORMAL HIGH (ref 8–23)
CO2: 25 mmol/L (ref 22–32)
Calcium: 9.5 mg/dL (ref 8.9–10.3)
Chloride: 104 mmol/L (ref 98–111)
Creatinine, Ser: 1.15 mg/dL — ABNORMAL HIGH (ref 0.44–1.00)
GFR, Estimated: 51 mL/min — ABNORMAL LOW (ref >60)
Glucose, Bld: 138 mg/dL — ABNORMAL HIGH (ref 70–99)
Potassium: 4.2 mmol/L (ref 3.5–5.1)
Sodium: 138 mmol/L (ref 135–145)

## 2020-04-24 LAB — D-DIMER, QUANTITATIVE: D-Dimer, Quant: 0.38 ug/mL-FEU (ref 0.00–0.50)

## 2020-04-24 LAB — TROPONIN I (HIGH SENSITIVITY)
Troponin I (High Sensitivity): 3 ng/L (ref <18)
Troponin I (High Sensitivity): 3 ng/L (ref <18)

## 2020-04-24 MED ORDER — NITROGLYCERIN 0.4 MG SL SUBL: 0.4000 mg | SUBLINGUAL_TABLET | SUBLINGUAL | 0 refills | Status: AC | PRN

## 2020-04-24 MED ORDER — MECLIZINE HCL 25 MG PO TABS: 25.0000 mg | ORAL_TABLET | Freq: Three times a day (TID) | ORAL | 0 refills | Status: DC | PRN

## 2020-04-24 MED ORDER — NITROGLYCERIN 0.4 MG SL SUBL: 0.4000 mg | SUBLINGUAL_TABLET | SUBLINGUAL | Status: DC | PRN

## 2020-04-24 MED ORDER — ACETAMINOPHEN 325 MG PO TABS
650.0000 mg | ORAL_TABLET | Freq: Once | ORAL | Status: AC
  Administered 2020-04-24: 650 mg via ORAL
  Filled 2020-04-24: qty 2
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
## 2020-04-24 NOTE — ED Notes (Signed)
Patient verbalized understanding of discharge instructions. Opportunity for questions and answers.  

## 2020-04-24 NOTE — ED Triage Notes (Signed)
Pt came in via ems due to complaints of chest pain, that started at 1:00 am today. Pt describes chest pain as pressure in the center of her chest and radiating to her jaw and right shoulder. EMS reports the 12 lead was unremarkable. EMS gave 324 aspiring but no nitro due to no iv access. EMS also reports pt was hypertensive, highest systolic was 387/564. Pt reports a headache, denies nausea,vomitting, sob. Pt does have a hx of cerebral hemorrhage.

## 2020-04-24 NOTE — Discharge Instructions (Addendum)
Please take aspirin 81 mg once a day until you see the cardiologist. After that, follow the cardiologist's recommendation.

## 2020-05-08 DIAGNOSIS — E7849 Other hyperlipidemia: Secondary | ICD-10-CM | POA: Diagnosis not present

## 2020-05-08 DIAGNOSIS — I10 Essential (primary) hypertension: Secondary | ICD-10-CM | POA: Diagnosis not present

## 2020-05-08 DIAGNOSIS — K219 Gastro-esophageal reflux disease without esophagitis: Secondary | ICD-10-CM | POA: Diagnosis not present

## 2020-05-08 DIAGNOSIS — F339 Major depressive disorder, recurrent, unspecified: Secondary | ICD-10-CM | POA: Diagnosis not present

## 2020-05-14 ENCOUNTER — Encounter: Payer: Self-pay | Admitting: Podiatry

## 2020-05-14 ENCOUNTER — Other Ambulatory Visit: Payer: Self-pay

## 2020-05-14 ENCOUNTER — Ambulatory Visit: Payer: PPO | Admitting: Podiatry

## 2020-05-14 DIAGNOSIS — M722 Plantar fascial fibromatosis: Secondary | ICD-10-CM | POA: Diagnosis not present

## 2020-05-14 NOTE — Progress Notes (Signed)
She presents today for follow-up of her plantar fasciitis of her right foot she states is so much better she says 100% better than it was.  States that her left foot gave her some grief just the other day when she was pushing the clutch and on her tractor.  She states that pain immediately went away and she had no other pain since.  Objective: Vitals are stable alert oriented x3 pulses are palpable.  No erythema edema cellulitis drainage or odor no pain on palpation medial calcaneal tubercle of the right heel.  Assessment: Well-healing plantar fasciitis right.  Plan: Follow-up with me 1 month if needed

## 2020-05-23 ENCOUNTER — Encounter: Payer: Self-pay | Admitting: Interventional Cardiology

## 2020-05-23 ENCOUNTER — Ambulatory Visit: Payer: PPO | Admitting: Interventional Cardiology

## 2020-05-23 ENCOUNTER — Other Ambulatory Visit: Payer: Self-pay

## 2020-05-23 VITALS — BP 110/62 | HR 78 | Ht 64.0 in | Wt 277.0 lb

## 2020-05-23 DIAGNOSIS — I10 Essential (primary) hypertension: Secondary | ICD-10-CM

## 2020-05-23 DIAGNOSIS — E782 Mixed hyperlipidemia: Secondary | ICD-10-CM

## 2020-05-23 DIAGNOSIS — R072 Precordial pain: Secondary | ICD-10-CM | POA: Diagnosis not present

## 2020-05-23 LAB — BASIC METABOLIC PANEL
BUN/Creatinine Ratio: 34 — ABNORMAL HIGH (ref 12–28)
BUN: 40 mg/dL — ABNORMAL HIGH (ref 8–27)
CO2: 23 mmol/L (ref 20–29)
Calcium: 10.1 mg/dL (ref 8.7–10.3)
Chloride: 99 mmol/L (ref 96–106)
Creatinine, Ser: 1.16 mg/dL — ABNORMAL HIGH (ref 0.57–1.00)
Glucose: 132 mg/dL — ABNORMAL HIGH (ref 65–99)
Potassium: 4.2 mmol/L (ref 3.5–5.2)
Sodium: 140 mmol/L (ref 134–144)
eGFR: 51 mL/min/{1.73_m2} — ABNORMAL LOW (ref >59)

## 2020-05-23 NOTE — Progress Notes (Signed)
Cardiology Office Note   Date:  05/23/2020   ID:  Beverly Young, DOB 1950/08/02, MRN [id number]  PCP:  Celene Squibb, MD    No chief complaint on file.  Chest pain  Wt Readings from Last 3 Encounters:  05/23/20 277 lb (125.6 kg)  04/24/20 260 lb (117.9 kg)  10/09/19 275 lb (124.7 kg)       History of Present Illness: Beverly Young is a 70 y.o. female  Who has RF for CAD.    Cath in 2010 showed: "Left ventriculography:  Left ventricular function is normal.  The LVEF  is estimated at 55-60%.  There are no regional wall motion  abnormalities.   ASSESSMENT:  1. Normal coronary arteries.  2. Normal left ventricular function.   PLAN:  The patient does not appear to have significant cardiac disease.  Her left ventricular end-diastolic pressure is mildly increased.  This  could contribute to her exertional dyspnea, but I suspect it is  noncardiac in nature.  Recommend followup with the patient's primary  care physician."  [facility] went to the ER for chest pain in 3/22 with records showing: "She has history of hypertension, hyperlipidemia and comes in because of an episode of chest tightness and pressure.  The pressure sensation was across the anterior chest and started at 1 AM while patient was at rest.  There was some radiation to the back as well as to the neck and jaw.  There is no associated dyspnea, nausea, diaphoresis.  Nothing made it better, nothing made it worse.  Discomfort was 9/10 initially, but has subsided to where it is now 5/10.  EMS gave her aspirin, but did not give nitroglycerin.  Blood pressure with EMS was as high as 192/100.  She had a similar episode 1 month ago which also occurred while she was at rest and only lasted about 5 minutes.  She is a non-smoker and there is no history of diabetes.  There is a fairly strong family history of premature coronary atherosclerosis.  Also, she has had a fracture boot on her left foot for 2 months, and now on the right  foot."  BP was high with this chest pain episode at work at [employer]. 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  Stress test in 2015.  No ischemia, LVEF 66%.  Echo in 2015: "Left ventricle: The cavity size was normal. Wall thickness was  increased in a pattern of mild LVH. Systolic function was normal.  The estimated ejection fraction was in the range of 60% to 65%.  Doppler parameters are consistent with abnormal left ventricular  relaxation (grade 1 diastolic dysfunction).  - Aortic valve: Mildly calcified annulus. Mildly thickened  leaflets. Valve area (VTI): 2.02 cm^2. Valve area (Vmax): 2.03  cm^2.  - Left atrium: The atrium was mildly dilated.  - Technically difficult study. "  Has had some more chest pain at times.  Symptoms are short-lived.  No clear trigger for the symptoms.  They seem to occur more randomly.  She does walk at work.  She has had some episodes at work but not clearly tied to walking.  Past Medical History:  Diagnosis Date  . GERD (gastroesophageal reflux disease)   . 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 Hemorrhoids   . HTN (hypertension)   . Obesity   . Peptic stricture of esophagus    last dilated 07/2007    Past Surgical History:  Procedure Laterality Date  .  CESAREAN SECTION    . COLONOSCOPY  07/11/07   external hemorrhoidal otherwise normal rectum pancolonic diverticula, suboptimal prep on the right (Next 07/2012)  . ESOPHAGOGASTRODUODENOSCOPY  07/11/07   circumferential distal esophageal ersionc/soft peptic ring dilated 15F/small hiatal herina  . KNEE SURGERY     right  . PARTIAL HYSTERECTOMY       Current Outpatient Medications  Medication Sig Dispense Refill  . acetaminophen (TYLENOL) 325 MG tablet Take 2 tablets (650 mg total) by mouth every 6 (six) hours as needed. 30 tablet 0  . amLODipine (NORVASC) 5 MG tablet Take 5 mg by mouth daily.     Marland Kitchen atorvastatin (LIPITOR) 40 MG tablet Take 40 mg by mouth at bedtime.    .  Calcium Carbonate-Vitamin D (CALCIUM-VITAMIN D) 500-200 MG-UNIT per tablet Take 1 tablet by mouth daily.    . diclofenac Sodium (VOLTAREN) 1 % GEL Apply 2 g topically 4 (four) times daily. 100 g 0  . escitalopram (LEXAPRO) 5 MG tablet Take 2 tablets by mouth daily.     . Glucosamine Sulfate 1000 MG CAPS Take 1 capsule by mouth daily.    . hydrochlorothiazide (HYDRODIURIL) 12.5 MG tablet Take 12.5 mg by mouth every morning.    . meloxicam (MOBIC) 15 MG tablet Take 1 tablet (15 mg total) by mouth daily. 30 tablet 3  . metoprolol succinate (TOPROL-XL) 50 MG 24 hr tablet Take 1 tablet by mouth daily.    . multivitamin-iron-minerals-folic acid (CENTRUM) chewable tablet Chew 1 tablet by mouth daily.    . naproxen (NAPROSYN) 250 MG tablet     . nitroGLYCERIN (NITROSTAT) 0.4 MG SL tablet Place 1 tablet (0.4 mg total) under the tongue every 5 (five) minutes as needed for chest pain (or chest tightness or heaviness or pressure). 30 tablet 0  . omeprazole (PRILOSEC) 20 MG capsule Take 1 capsule (20 mg total) by mouth daily. 30 capsule 3  . Potassium Gluconate 550 MG TABS Take 1 tablet by mouth daily.     No current facility-administered medications for this visit.    Allergies:   Codeine    Social History:  The patient  reports that she has never smoked. She has never used smokeless tobacco. She reports that she does not drink alcohol and does not use drugs.   Family History:  The patient's family history includes Colon cancer (age of onset: 15) in her father; Colon polyps in her sister; Liver cancer in her paternal aunt.    ROS:  Please see the history of present illness.   Otherwise, review of systems are positive for dizziness.   All other systems are reviewed and negative.    PHYSICAL EXAM: VS:  BP 110/62   Pulse 78   Ht 5\' 4"  (1.626 m)   Wt 277 lb (125.6 kg)   BMI 47.55 kg/m  , BMI Body mass index is 47.55 kg/m. GEN: Well nourished, well developed, in no acute distress  HEENT: normal   Neck: no JVD, carotid bruits, or masses Cardiac: RRR; no murmurs, rubs, or gallops,no edema  Respiratory:  clear to auscultation bilaterally, normal work of breathing GI: soft, nontender, nondistended, + BS MS: no deformity or atrophy  Skin: warm and dry, no rash Neuro:  Strength and sensation are intact Psych: euthymic mood, full affect   EKG:   The ekg ordered today demonstrates Normal sinus rhythm, no ST changes   Recent Labs: 04/24/2020: BUN 26; Creatinine, Ser 1.15; Hemoglobin 13.6; Platelets 190; Potassium 4.2; Sodium 138  Lipid Panel    Component Value Date/Time   CHOL 165 12/27/2007 2110   TRIG 90 12/27/2007 2110   HDL 68 12/27/2007 2110   CHOLHDL 2.4 Ratio 12/27/2007 2110   VLDL 18 12/27/2007 2110   [place] 79 12/27/2007 2110     Other studies Reviewed: Additional studies/ records that were reviewed today with results demonstrating: prior results reviewed.   ASSESSMENT AND PLAN:  1. Precordial chest pain: Has had negative prior cardiac work-ups with stress test in 2015 and cardiac cath in 2010.  Some typical and some atypical features.  We will plan for coronary CTA.  Take an extra metoprolol 50 mg tablet to help slow heart rate. 2. Hypertension: Low-salt diet.  Avoid processed foods.  The current medical regimen is effective;  continue present plan and medications.   3. Hyperlipidemia: Whole food, plant-based diet.  LDL 61 in October 2021.  Continue lipid-lowering therapy.  If she does have significant coronary disease, will have to change her statin to either atorvastatin or rosuvastatin. 4. Morbid obesity: She avoids sugary soft drinks.  Given the recent dizziness that she had and the fact that she does not drink water, I recommended that she switch to more water.  Long-term, more plant-based diet will be helpful. 5. History of prior cerebral hemorrhage in 1993 noted from history section of old CT scan report.   Current medicines are reviewed at length with the  patient today.  The patient concerns regarding her medicines were addressed.  The following changes have been made: As above  Labs/ tests ordered today include:   Orders Placed This Encounter  Procedures  . CT CORONARY MORPH W/CTA COR W/SCORE W/CA W/CM &/OR WO/CM  . CT CORONARY FRACTIONAL FLOW RESERVE DATA PREP  . CT CORONARY FRACTIONAL FLOW RESERVE FLUID ANALYSIS  . Basic Metabolic Panel (BMET)    Recommend 150 minutes/week of aerobic exercise Low fat, low carb, high fiber diet recommended  Disposition:   FU for CTA   Signed, Larae Grooms, MD  05/23/2020 11:53 AM    [facility] [place], [place], [place]  [address] Phone: [phone]; Fax: [phone]

## 2020-05-23 NOTE — Patient Instructions (Signed)
Medication Instructions:  Your physician recommends that you continue on your current medications as directed. Please refer to the Current Medication list given to you today.  *If you need a refill on your cardiac medications before your next appointment, please call your pharmacy*   Lab Work: Lab work to be done today--BMP If you have labs (blood work) drawn today and your tests are completely normal, you will receive your results only by: Marland Kitchen MyChart Message (if you have MyChart) OR . A paper copy in the mail If you have any lab test that is abnormal or we need to change your treatment, we will call you to review the results.   Testing/Procedures: Your physician has requested that you have cardiac CT. Cardiac computed tomography (CT) is a painless test that uses an x-ray machine to take clear, detailed pictures of your heart. For further information please visit [email or web address].[email or web address]. Please follow instruction sheet as given.      Follow-Up: At [facility], you and your health needs are our priority.  As part of our continuing mission to provide you with exceptional heart care, we have created designated Provider Care Teams.  These Care Teams include your primary Cardiologist (physician) and Advanced Practice Providers (APPs -  Physician Assistants and Nurse Practitioners) who all work together to provide you with the care you need, when you need it.  We recommend signing up for the patient portal called "MyChart".  Sign up information is provided on this After Visit Summary.  MyChart is used to connect with patients for Virtual Visits (Telemedicine).  Patients are able to view lab/test results, encounter notes, upcoming appointments, etc.  Non-urgent messages can be sent to your provider as well.   To learn more about what you can do with MyChart, go to [email or web address].    Your next appointment:   Based on CT results  The format for your next appointment:   In  Person  Provider:   You may see Larae Grooms, MD or one of the following Advanced Practice Providers on your designated Care Team:    Melina Copa, PA-C  Ermalinda Barrios, PA-C    Other Instructions  Your cardiac CT will be scheduled at one of the below locations:   [facility] [address] [place], [place] [address] [phone]  [place] [address] [place], [place] [address] [phone]  If scheduled at [facility], please arrive at the [facility] main entrance (entrance A) of [facility] 30 minutes prior to test start time. Proceed to the [facility] Radiology Department (first floor) to check-in and test prep.  If scheduled at [facility], please arrive 15 mins early for check-in and test prep.  Please follow these instructions carefully (unless otherwise directed):     On the Night Before the Test: . Be sure to Drink plenty of water. . Do not consume any caffeinated/decaffeinated beverages or chocolate 12 hours prior to your test. . Do not take any antihistamines 12 hours prior to your test.   On the Day of the Test: . Drink plenty of water until 1 hour prior to the test. . Do not eat any food 4 hours prior to the test. . You may take your regular medications prior to the test.  .  Take 2 tablets of your Metoprolol Succinate 2 hours prior to the test . HOLD Furosemide/Hydrochlorothiazide morning of the test. . FEMALES- please wear underwire-free bra  if available          After the Test: . Drink plenty of water. . After receiving IV contrast, you may experience a mild flushed feeling. This is normal. . On occasion, you may experience a mild rash up to 24 hours after the test. This is not dangerous. If this occurs, you can take Benadryl 25 mg and increase your fluid intake. . If you experience trouble breathing, this can be serious. If it is  severe call 911 IMMEDIATELY. If it is mild, please call our office. . If you take any of these medications: Glipizide/Metformin, Avandament, Glucavance, please do not take 48 hours after completing test unless otherwise instructed.   Once we have confirmed authorization from your insurance company, we will call you to set up a date and time for your test. Based on how quickly your insurance processes prior authorizations requests, please allow up to 4 weeks to be contacted for scheduling your Cardiac CT appointment. Be advised that routine Cardiac CT appointments could be scheduled as many as 8 weeks after your provider has ordered it.  For non-scheduling related questions, please contact the cardiac imaging nurse navigator should you have any questions/concerns: Marchia Bond, Cardiac Imaging Nurse Navigator Gordy Clement, Cardiac Imaging Nurse Navigator [facility] Direct Office Dial: [phone]   For scheduling needs, including cancellations and rescheduling, please call [place], [phone].

## 2020-06-06 ENCOUNTER — Telehealth (HOSPITAL_COMMUNITY): Payer: Self-pay | Admitting: Emergency Medicine

## 2020-06-06 NOTE — Telephone Encounter (Signed)
Attempted to call patient regarding upcoming cardiac CT appointment. °Left message on voicemail with name and callback number °Anastacia Reinecke RN Navigator Cardiac Imaging °[facility] °[phone] Office °[phone] Cell ° °

## 2020-06-07 ENCOUNTER — Telehealth: Payer: Self-pay | Admitting: Interventional Cardiology

## 2020-06-07 ENCOUNTER — Other Ambulatory Visit: Payer: Self-pay

## 2020-06-07 ENCOUNTER — Telehealth (HOSPITAL_COMMUNITY): Payer: Self-pay | Admitting: Emergency Medicine

## 2020-06-07 ENCOUNTER — Ambulatory Visit (HOSPITAL_COMMUNITY)
Admission: RE | Admit: 2020-06-07 | Discharge: 2020-06-07 | Disposition: A | Discharge status: Home / Self Care | Payer: PPO | Source: Ambulatory Visit | Attending: Interventional Cardiology | Admitting: Interventional Cardiology

## 2020-06-07 DIAGNOSIS — R072 Precordial pain: Secondary | ICD-10-CM | POA: Insufficient documentation

## 2020-06-07 MED ORDER — IOHEXOL 350 MG/ML SOLN
125.0000 mL | Freq: Once | INTRAVENOUS | Status: AC | PRN
  Administered 2020-06-07: 125 mL via INTRAVENOUS

## 2020-06-07 MED ORDER — NITROGLYCERIN 0.4 MG SL SUBL
SUBLINGUAL_TABLET | SUBLINGUAL | Status: AC
  Administered 2020-06-07: 0.8 mg via SUBLINGUAL
  Filled 2020-06-07: qty 2

## 2020-06-07 MED ORDER — NITROGLYCERIN 0.4 MG SL SUBL: 0.8000 mg | SUBLINGUAL_TABLET | Freq: Once | SUBLINGUAL | Status: AC

## 2020-06-07 NOTE — Telephone Encounter (Signed)
Reaching out to patient to offer assistance regarding upcoming cardiac imaging study; pt verbalizes understanding of appt date/time, parking situation and where to check in, pre-test NPO status and medications ordered, and verified current allergies; name and call back number provided for further questions should they arise Marchia Bond RN Navigator Cardiac Imaging [facility] [phone] office [phone] cell   100mg  metoprolol succ PTA Holding daily BP meds Clarise Cruz

## 2020-06-07 NOTE — Telephone Encounter (Signed)
Spoke with pt and advised per Dr Irish Lack, Calcium score of 0.  No CAD.  Continue Lipitor, healthy diet and regular exercise.  Pt verbalizes understanding and thanked Therapist, sports for the call.

## 2020-06-07 NOTE — Telephone Encounter (Signed)
New message     Returning a call to the nurse to get CT results 

## 2020-06-09 DIAGNOSIS — R600 Localized edema: Secondary | ICD-10-CM | POA: Diagnosis not present

## 2020-06-09 DIAGNOSIS — R252 Cramp and spasm: Secondary | ICD-10-CM | POA: Diagnosis not present

## 2020-06-09 DIAGNOSIS — I10 Essential (primary) hypertension: Secondary | ICD-10-CM | POA: Diagnosis not present

## 2020-06-09 DIAGNOSIS — R944 Abnormal results of kidney function studies: Secondary | ICD-10-CM | POA: Diagnosis not present

## 2020-06-09 DIAGNOSIS — R17 Unspecified jaundice: Secondary | ICD-10-CM | POA: Diagnosis not present

## 2020-06-09 DIAGNOSIS — K219 Gastro-esophageal reflux disease without esophagitis: Secondary | ICD-10-CM | POA: Diagnosis not present

## 2020-06-09 DIAGNOSIS — E782 Mixed hyperlipidemia: Secondary | ICD-10-CM | POA: Diagnosis not present

## 2020-06-09 DIAGNOSIS — M792 Neuralgia and neuritis, unspecified: Secondary | ICD-10-CM | POA: Diagnosis not present

## 2020-06-09 DIAGNOSIS — R7301 Impaired fasting glucose: Secondary | ICD-10-CM | POA: Diagnosis not present

## 2020-06-13 DIAGNOSIS — E7849 Other hyperlipidemia: Secondary | ICD-10-CM | POA: Diagnosis not present

## 2020-06-13 DIAGNOSIS — E782 Mixed hyperlipidemia: Secondary | ICD-10-CM | POA: Diagnosis not present

## 2020-06-13 DIAGNOSIS — R944 Abnormal results of kidney function studies: Secondary | ICD-10-CM | POA: Diagnosis not present

## 2020-06-13 DIAGNOSIS — I10 Essential (primary) hypertension: Secondary | ICD-10-CM | POA: Diagnosis not present

## 2020-06-13 DIAGNOSIS — R7301 Impaired fasting glucose: Secondary | ICD-10-CM | POA: Diagnosis not present

## 2020-06-13 DIAGNOSIS — Z6841 Body Mass Index (BMI) 40.0 and over, adult: Secondary | ICD-10-CM | POA: Diagnosis not present

## 2020-06-13 DIAGNOSIS — M792 Neuralgia and neuritis, unspecified: Secondary | ICD-10-CM | POA: Diagnosis not present

## 2020-06-13 DIAGNOSIS — R7303 Prediabetes: Secondary | ICD-10-CM | POA: Diagnosis not present

## 2020-06-19 DIAGNOSIS — R7301 Impaired fasting glucose: Secondary | ICD-10-CM | POA: Diagnosis not present

## 2020-06-19 DIAGNOSIS — I10 Essential (primary) hypertension: Secondary | ICD-10-CM | POA: Diagnosis not present

## 2020-06-19 DIAGNOSIS — R252 Cramp and spasm: Secondary | ICD-10-CM | POA: Diagnosis not present

## 2020-06-19 DIAGNOSIS — R600 Localized edema: Secondary | ICD-10-CM | POA: Diagnosis not present

## 2020-06-19 DIAGNOSIS — R944 Abnormal results of kidney function studies: Secondary | ICD-10-CM | POA: Diagnosis not present

## 2020-06-19 DIAGNOSIS — R17 Unspecified jaundice: Secondary | ICD-10-CM | POA: Diagnosis not present

## 2020-06-19 DIAGNOSIS — E782 Mixed hyperlipidemia: Secondary | ICD-10-CM | POA: Diagnosis not present

## 2020-06-19 DIAGNOSIS — M792 Neuralgia and neuritis, unspecified: Secondary | ICD-10-CM | POA: Diagnosis not present

## 2020-07-11 DIAGNOSIS — M1711 Unilateral primary osteoarthritis, right knee: Secondary | ICD-10-CM | POA: Diagnosis not present

## 2020-07-11 DIAGNOSIS — M25561 Pain in right knee: Secondary | ICD-10-CM | POA: Diagnosis not present

## 2020-08-08 DIAGNOSIS — I10 Essential (primary) hypertension: Secondary | ICD-10-CM | POA: Diagnosis not present

## 2020-08-08 DIAGNOSIS — K219 Gastro-esophageal reflux disease without esophagitis: Secondary | ICD-10-CM | POA: Diagnosis not present

## 2020-08-16 DIAGNOSIS — M1711 Unilateral primary osteoarthritis, right knee: Secondary | ICD-10-CM | POA: Diagnosis not present

## 2020-08-16 DIAGNOSIS — M25561 Pain in right knee: Secondary | ICD-10-CM | POA: Diagnosis not present

## 2020-08-29 DIAGNOSIS — M25561 Pain in right knee: Secondary | ICD-10-CM | POA: Diagnosis not present

## 2020-08-29 DIAGNOSIS — M1711 Unilateral primary osteoarthritis, right knee: Secondary | ICD-10-CM | POA: Diagnosis not present

## 2020-09-08 DIAGNOSIS — I10 Essential (primary) hypertension: Secondary | ICD-10-CM | POA: Diagnosis not present

## 2020-09-08 DIAGNOSIS — K219 Gastro-esophageal reflux disease without esophagitis: Secondary | ICD-10-CM | POA: Diagnosis not present

## 2020-09-11 DIAGNOSIS — M1711 Unilateral primary osteoarthritis, right knee: Secondary | ICD-10-CM | POA: Diagnosis not present

## 2020-09-11 DIAGNOSIS — M25561 Pain in right knee: Secondary | ICD-10-CM | POA: Diagnosis not present

## 2020-10-09 DIAGNOSIS — K219 Gastro-esophageal reflux disease without esophagitis: Secondary | ICD-10-CM | POA: Diagnosis not present

## 2020-10-09 DIAGNOSIS — I10 Essential (primary) hypertension: Secondary | ICD-10-CM | POA: Diagnosis not present

## 2020-10-30 ENCOUNTER — Other Ambulatory Visit (HOSPITAL_COMMUNITY): Payer: Self-pay | Admitting: Internal Medicine

## 2020-10-30 DIAGNOSIS — Z1231 Encounter for screening mammogram for malignant neoplasm of breast: Secondary | ICD-10-CM

## 2020-11-08 DIAGNOSIS — I10 Essential (primary) hypertension: Secondary | ICD-10-CM | POA: Diagnosis not present

## 2020-11-08 DIAGNOSIS — K219 Gastro-esophageal reflux disease without esophagitis: Secondary | ICD-10-CM | POA: Diagnosis not present

## 2020-12-09 DIAGNOSIS — K219 Gastro-esophageal reflux disease without esophagitis: Secondary | ICD-10-CM | POA: Diagnosis not present

## 2020-12-09 DIAGNOSIS — I10 Essential (primary) hypertension: Secondary | ICD-10-CM | POA: Diagnosis not present

## 2020-12-20 DIAGNOSIS — I10 Essential (primary) hypertension: Secondary | ICD-10-CM | POA: Diagnosis not present

## 2020-12-20 DIAGNOSIS — R7301 Impaired fasting glucose: Secondary | ICD-10-CM | POA: Diagnosis not present

## 2020-12-25 DIAGNOSIS — I10 Essential (primary) hypertension: Secondary | ICD-10-CM | POA: Diagnosis not present

## 2020-12-25 DIAGNOSIS — R252 Cramp and spasm: Secondary | ICD-10-CM | POA: Diagnosis not present

## 2020-12-25 DIAGNOSIS — Z0001 Encounter for general adult medical examination with abnormal findings: Secondary | ICD-10-CM | POA: Diagnosis not present

## 2020-12-25 DIAGNOSIS — R7303 Prediabetes: Secondary | ICD-10-CM | POA: Diagnosis not present

## 2020-12-25 DIAGNOSIS — K219 Gastro-esophageal reflux disease without esophagitis: Secondary | ICD-10-CM | POA: Diagnosis not present

## 2020-12-25 DIAGNOSIS — R17 Unspecified jaundice: Secondary | ICD-10-CM | POA: Diagnosis not present

## 2020-12-25 DIAGNOSIS — Z23 Encounter for immunization: Secondary | ICD-10-CM | POA: Diagnosis not present

## 2020-12-25 DIAGNOSIS — R944 Abnormal results of kidney function studies: Secondary | ICD-10-CM | POA: Diagnosis not present

## 2020-12-25 DIAGNOSIS — Z1211 Encounter for screening for malignant neoplasm of colon: Secondary | ICD-10-CM | POA: Diagnosis not present

## 2020-12-25 DIAGNOSIS — E782 Mixed hyperlipidemia: Secondary | ICD-10-CM | POA: Diagnosis not present

## 2020-12-25 DIAGNOSIS — R600 Localized edema: Secondary | ICD-10-CM | POA: Diagnosis not present

## 2020-12-31 ENCOUNTER — Encounter: Payer: Self-pay | Admitting: Internal Medicine

## 2021-01-08 DIAGNOSIS — K219 Gastro-esophageal reflux disease without esophagitis: Secondary | ICD-10-CM | POA: Diagnosis not present

## 2021-01-08 DIAGNOSIS — I10 Essential (primary) hypertension: Secondary | ICD-10-CM | POA: Diagnosis not present

## 2021-02-07 DIAGNOSIS — I10 Essential (primary) hypertension: Secondary | ICD-10-CM | POA: Diagnosis not present

## 2021-02-07 DIAGNOSIS — K219 Gastro-esophageal reflux disease without esophagitis: Secondary | ICD-10-CM | POA: Diagnosis not present

## 2021-02-12 ENCOUNTER — Ambulatory Visit: Payer: PPO

## 2021-02-20 DIAGNOSIS — R509 Fever, unspecified: Secondary | ICD-10-CM | POA: Diagnosis not present

## 2021-02-20 DIAGNOSIS — R059 Cough, unspecified: Secondary | ICD-10-CM | POA: Diagnosis not present

## 2021-02-20 DIAGNOSIS — R197 Diarrhea, unspecified: Secondary | ICD-10-CM | POA: Diagnosis not present

## 2021-02-20 DIAGNOSIS — R0981 Nasal congestion: Secondary | ICD-10-CM | POA: Diagnosis not present

## 2021-03-11 DIAGNOSIS — I10 Essential (primary) hypertension: Secondary | ICD-10-CM | POA: Diagnosis not present

## 2021-03-11 DIAGNOSIS — K219 Gastro-esophageal reflux disease without esophagitis: Secondary | ICD-10-CM | POA: Diagnosis not present

## 2021-03-20 DIAGNOSIS — H25813 Combined forms of age-related cataract, bilateral: Secondary | ICD-10-CM | POA: Diagnosis not present

## 2021-03-20 DIAGNOSIS — H524 Presbyopia: Secondary | ICD-10-CM | POA: Diagnosis not present

## 2021-03-20 DIAGNOSIS — H5213 Myopia, bilateral: Secondary | ICD-10-CM | POA: Diagnosis not present

## 2021-03-31 ENCOUNTER — Other Ambulatory Visit: Payer: Self-pay

## 2021-03-31 ENCOUNTER — Ambulatory Visit: Payer: Self-pay

## 2021-04-08 DIAGNOSIS — K219 Gastro-esophageal reflux disease without esophagitis: Secondary | ICD-10-CM | POA: Diagnosis not present

## 2021-04-08 DIAGNOSIS — I10 Essential (primary) hypertension: Secondary | ICD-10-CM | POA: Diagnosis not present

## 2021-04-28 ENCOUNTER — Ambulatory Visit: Payer: PPO | Admitting: Gastroenterology

## 2021-04-28 ENCOUNTER — Encounter: Payer: Self-pay | Admitting: Gastroenterology

## 2021-04-28 ENCOUNTER — Other Ambulatory Visit: Payer: Self-pay

## 2021-04-28 VITALS — BP 138/72 | HR 70 | Temp 97.3°F | Ht 64.5 in | Wt 270.8 lb

## 2021-04-28 DIAGNOSIS — K219 Gastro-esophageal reflux disease without esophagitis: Secondary | ICD-10-CM

## 2021-04-28 DIAGNOSIS — Z800 Family history of malignant neoplasm of digestive organs: Secondary | ICD-10-CM | POA: Diagnosis not present

## 2021-04-28 DIAGNOSIS — R1319 Other dysphagia: Secondary | ICD-10-CM

## 2021-04-28 NOTE — Progress Notes (Signed)
? ? ? ?GI Office Note   ? ?Referring Provider: Celene Squibb, MD ?Primary Care Physician:  Celene Squibb, MD  ?Primary Gastroenterologist: Garfield Cornea, MD ? ?Chief Complaint  ? ?Chief Complaint  ?Patient presents with  ? Dysphagia  ?  Food gets hung up, trouble swallowing.  ? Colonoscopy  ? ? ? ?History of Present Illness  ? ?Beverly Young is a 71 y.o. female presenting today at the request of Dr. Nevada Crane for colonoscopy and possible endoscopy. ? ?Patient last seen in 2013.  She has a history of chronic GERD.  History of colon cancer, mother at an advanced age.  Patient's last colonoscopy in 2009 but prep was poor and exam compromised.  Colonoscopy in 2006, inflammatory polyp.  EGD 2009 showed circumferential distal esophageal erosions consistent with erosive reflux esophagitis, untreated.  Soft peptic stricture/ring status post dilation.  Small hiatal hernia.  Antral erosions. ? ?She presents desiring colonoscopy and upper endoscopy. She has noticed increased issues with solid food dysphagia worse over the past few months. Also with some breakthrough symptoms on pantoprazole. Takes TUMs several days per week. BM regular. No melena, brbpr. No abdominal pain.  ? ?Still working, [employer] at [employer]. Works night shift. ?  ?Medications  ? ?Current Outpatient Medications  ?Medication Sig Dispense Refill  ? acetaminophen (TYLENOL) 325 MG tablet Take 2 tablets (650 mg total) by mouth every 6 (six) hours as needed. 30 tablet 0  ? amLODipine (NORVASC) 5 MG tablet Take 5 mg by mouth daily.     ? atorvastatin (LIPITOR) 40 MG tablet Take 40 mg by mouth at bedtime.    ? Calcium Carbonate-Vitamin D (CALCIUM-VITAMIN D) 500-200 MG-UNIT per tablet Take 1 tablet by mouth daily.    ? Glucosamine Sulfate 1000 MG CAPS Take 1 capsule by mouth daily.    ? hydrochlorothiazide (HYDRODIURIL) 12.5 MG tablet Take 12.5 mg by mouth every morning.    ? metoprolol succinate (TOPROL-XL) 50 MG 24 hr tablet Take 1 tablet by mouth daily.    ?  multivitamin-iron-minerals-folic acid (CENTRUM) chewable tablet Chew 1 tablet by mouth daily.    ? naproxen (NAPROSYN) 250 MG tablet     ? nitroGLYCERIN (NITROSTAT) 0.4 MG SL tablet Place 1 tablet (0.4 mg total) under the tongue every 5 (five) minutes as needed for chest pain (or chest tightness or heaviness or pressure). 30 tablet 0  ? olmesartan (BENICAR) 40 MG tablet Take 40 mg by mouth daily.    ? pantoprazole (PROTONIX) 40 MG tablet Take 40 mg by mouth daily.    ? Potassium Gluconate 550 MG TABS Take 1 tablet by mouth daily.    ? vitamin C (ASCORBIC ACID) 500 MG tablet Take 500 mg by mouth daily.    ? ?No current facility-administered medications for this visit.  ? ? ?Allergies  ? ?Allergies as of 04/28/2021 - Review Complete 04/28/2021  ?Allergen Reaction Noted  ? Codeine Other (See Comments) 01/15/2011  ? ? ?Past Medical History  ? ?Past Medical History:  ?Diagnosis Date  ? GERD (gastroesophageal reflux disease)   ? Hemorrhage 1993  ? intracerebellar  ? Hemorrhoids   ? HTN (hypertension)   ? Obesity   ? Peptic stricture of esophagus   ? last dilated 07/2007  ? ? ?Past Surgical History  ? ?Past Surgical History:  ?Procedure Laterality Date  ? CESAREAN SECTION    ? COLONOSCOPY  07/11/2007  ? external hemorrhoidal otherwise normal rectum pancolonic diverticula, suboptimal prep on the  right (Next 07/2012)  ? ESOPHAGOGASTRODUODENOSCOPY  07/11/2007  ? circumferential distal esophageal ersionc/soft peptic ring dilated 94F/small hiatal herina  ? KNEE SURGERY    ? right  ? NECK SURGERY  2002  ? ruptured disk  ? PARTIAL HYSTERECTOMY    ? ? ?Past Family History  ? ?Family History  ?Problem Relation Age of Onset  ? Heart disease Mother   ? Colon cancer Father 25  ? Colon polyps Sister   ? Heart disease Sister   ? Cirrhosis Brother   ?     etoh  ? Liver cancer Paternal Aunt   ? ? ?Past Social History  ? ?Social History  ? ?Socioeconomic History  ? Marital status: Widowed  ?  Spouse name: Not on file  ? Number of children:  Not on file  ? Years of education: Not on file  ? Highest education level: Not on file  ?Occupational History  ? Not on file  ?Tobacco Use  ? Smoking status: Never  ? Smokeless tobacco: Never  ?Vaping Use  ? Vaping Use: Never used  ?Substance and Sexual Activity  ? Alcohol use: No  ? Drug use: No  ? Sexual activity: Not Currently  ?Other Topics Concern  ? Not on file  ?Social History Narrative  ? Not on file  ? ?Social Determinants of Health  ? ?Financial Resource Strain: Not on file  ?Food Insecurity: Not on file  ?Transportation Needs: Not on file  ?Physical Activity: Not on file  ?Stress: Not on file  ?Social Connections: Not on file  ?Intimate Partner Violence: Not on file  ? ? ?Review of Systems  ? ?General: Negative for anorexia, weight loss, fever, chills, fatigue, weakness. ?Eyes: Negative for vision changes.  ?ENT: Negative for hoarseness, difficulty swallowing , nasal congestion. ?CV: Negative for chest pain, angina, palpitations, dyspnea on exertion, peripheral edema.  ?Respiratory: Negative for dyspnea at rest, dyspnea on exertion, cough, sputum, wheezing.  ?GI: See history of present illness. ?GU:  Negative for dysuria, hematuria, urinary incontinence, urinary frequency, nocturnal urination.  ?MS: Negative for joint pain, low back pain.  ?Derm: Negative for rash or itching.  ?Neuro: Negative for weakness, abnormal sensation, seizure, frequent headaches, memory loss,  ?confusion.  ?Psych: Negative for anxiety, depression, suicidal ideation, hallucinations.  ?Endo: Negative for unusual weight change.  ?Heme: Negative for bruising or bleeding. ?Allergy: Negative for rash or hives. ? ?Physical Exam  ? ?BP 138/72 (BP Location: Right Arm, Patient Position: Sitting, Cuff Size: Large)   Pulse 70   Temp (!) 97.3 ?F (36.3 ?C) (Temporal)   Ht 5' 4.5" (1.638 m)   Wt 270 lb 12.8 oz (122.8 kg)   SpO2 98%   BMI 45.76 kg/m?  ?  ?General: Well-nourished, well-developed in no acute distress.  ?Head: Normocephalic,  atraumatic.   ?Eyes: Conjunctiva pink, no icterus. ?Mouth: masked ?Neck: Supple without thyromegaly, masses, or lymphadenopathy.  ?Lungs: Clear to auscultation bilaterally.  ?Heart: Regular rate and rhythm, no murmurs rubs or gallops.  ?Abdomen: Bowel sounds are normal, nontender, nondistended, no hepatosplenomegaly or masses,  ?no abdominal bruits or hernia, no rebound or guarding.   ?Rectal: not performed ?Extremities: No lower extremity edema. No clubbing or deformities.  ?Neuro: Alert and oriented x 4 , grossly normal neurologically.  ?Skin: Warm and dry, no rash or jaundice.   ?Psych: Alert and cooperative, normal mood and affect. ? ?Labs  ? ?Lab Results  ?Component Value Date  ? CREATININE 1.16 (H) 05/23/2020  ? BUN 40 (  H) 05/23/2020  ? NA 140 05/23/2020  ? K 4.2 05/23/2020  ? CL 99 05/23/2020  ? CO2 23 05/23/2020  ? ?Lab Results  ?Component Value Date  ? ALT 25 01/15/2011  ? AST 31 01/15/2011  ? ALKPHOS 43 01/15/2011  ? BILITOT 1.2 01/15/2011  ? ?Lab Results  ?Component Value Date  ? WBC 10.9 (H) 04/24/2020  ? HGB 13.6 04/24/2020  ? HCT 41.5 04/24/2020  ? MCV 90.0 04/24/2020  ? PLT 190 04/24/2020  ? ? ?Imaging Studies  ? ?No results found. ? ?Assessment  ? ?FRANZISKA PODGURSKI is a 71 y.o. female presenting today to schedule colonoscopy and upper endoscopy. ? ?Colon cancer screening: Father was diagnosed with colon cancer in his 30s.  She had a younger sister who had colon polyps as well.  Patient had a polyp removed in 2006 but pathology not available.  Her last colonoscopy was in 2009, she had single external hemorrhoid tag, right colon with thin coating of stool covering a good bit.  Patient denies any bowel concerns.  She wants to pursue screening colonoscopy. ? ?GERD/solid food dysphagia: Progressive over the past several months.  She has a history of erosive reflux esophagitis, esophageal ring requiring dilation in the past.  Having some breakthrough reflux symptoms which is controlled with over-the-counter  antacids.  Differential diagnosis includes reflux esophagitis, esophageal web/ring/stricture, malignancy is not excluded. ? ? ?PLAN  ? ?Colonoscopy/EGD/esophageal dilation with Dr. Gala Romney.  ASA 3.  I have dis

## 2021-04-28 NOTE — Patient Instructions (Signed)
Upper endoscopy and colonoscopy to be scheduled.  See separate instructions. ?Continue pantoprazole 40 mg daily. ?

## 2021-05-09 DIAGNOSIS — I10 Essential (primary) hypertension: Secondary | ICD-10-CM | POA: Diagnosis not present

## 2021-05-09 DIAGNOSIS — K219 Gastro-esophageal reflux disease without esophagitis: Secondary | ICD-10-CM | POA: Diagnosis not present

## 2021-05-14 ENCOUNTER — Telehealth: Payer: Self-pay | Admitting: *Deleted

## 2021-05-14 NOTE — Telephone Encounter (Signed)
Called pt to scheduled TCS/EGD/ED with propofol asa 3. She wants a Friday only. As of now Dr. Gala Young does not have any Friday's available for April/may. She wants to see about June. Will call once we receive that schedule ?

## 2021-06-08 DIAGNOSIS — E782 Mixed hyperlipidemia: Secondary | ICD-10-CM | POA: Diagnosis not present

## 2021-06-08 DIAGNOSIS — K219 Gastro-esophageal reflux disease without esophagitis: Secondary | ICD-10-CM | POA: Diagnosis not present

## 2021-06-08 DIAGNOSIS — I10 Essential (primary) hypertension: Secondary | ICD-10-CM | POA: Diagnosis not present

## 2021-06-19 DIAGNOSIS — E782 Mixed hyperlipidemia: Secondary | ICD-10-CM | POA: Diagnosis not present

## 2021-06-19 DIAGNOSIS — R7303 Prediabetes: Secondary | ICD-10-CM | POA: Diagnosis not present

## 2021-06-19 NOTE — Telephone Encounter (Signed)
Tried to call pt to schedule TCS/EGD/DIL, LMOVM for return call. ?

## 2021-06-23 ENCOUNTER — Other Ambulatory Visit: Payer: Self-pay

## 2021-06-23 MED ORDER — PEG 3350-KCL-NA BICARB-NACL 420 G PO SOLR: 4000.0000 mL | ORAL | 0 refills | Status: AC

## 2021-06-23 NOTE — Telephone Encounter (Signed)
Pre-op appt 07/22/21. Appt letter mailed with procedure instructions. ?

## 2021-06-23 NOTE — Telephone Encounter (Signed)
Spoke to pt, TCS/EGD/DIL scheduled for 07/25/21 at 12:00pm. Rx for prep sent to pharmacy. Orders entered. ?

## 2021-06-24 DIAGNOSIS — I10 Essential (primary) hypertension: Secondary | ICD-10-CM | POA: Diagnosis not present

## 2021-06-24 DIAGNOSIS — R17 Unspecified jaundice: Secondary | ICD-10-CM | POA: Diagnosis not present

## 2021-06-24 DIAGNOSIS — E782 Mixed hyperlipidemia: Secondary | ICD-10-CM | POA: Diagnosis not present

## 2021-06-24 DIAGNOSIS — R944 Abnormal results of kidney function studies: Secondary | ICD-10-CM | POA: Diagnosis not present

## 2021-06-24 DIAGNOSIS — K219 Gastro-esophageal reflux disease without esophagitis: Secondary | ICD-10-CM | POA: Diagnosis not present

## 2021-06-24 DIAGNOSIS — R252 Cramp and spasm: Secondary | ICD-10-CM | POA: Diagnosis not present

## 2021-06-24 DIAGNOSIS — R600 Localized edema: Secondary | ICD-10-CM | POA: Diagnosis not present

## 2021-06-24 DIAGNOSIS — Z6841 Body Mass Index (BMI) 40.0 and over, adult: Secondary | ICD-10-CM | POA: Diagnosis not present

## 2021-06-24 DIAGNOSIS — R7303 Prediabetes: Secondary | ICD-10-CM | POA: Diagnosis not present

## 2021-06-24 DIAGNOSIS — Z1211 Encounter for screening for malignant neoplasm of colon: Secondary | ICD-10-CM | POA: Diagnosis not present

## 2021-07-09 DIAGNOSIS — K219 Gastro-esophageal reflux disease without esophagitis: Secondary | ICD-10-CM | POA: Diagnosis not present

## 2021-07-09 DIAGNOSIS — E782 Mixed hyperlipidemia: Secondary | ICD-10-CM | POA: Diagnosis not present

## 2021-07-09 DIAGNOSIS — I10 Essential (primary) hypertension: Secondary | ICD-10-CM | POA: Diagnosis not present

## 2021-07-21 NOTE — Patient Instructions (Signed)
Beverly Young  07/21/2021     '@PREFPERIOPPHARMACY'$ @   Your procedure is scheduled on  07/25/2021.   Report to [facility] at  [place]  A.M.   Call this number if you have problems the morning of surgery:  [phone]   Remember:   Follow the diet and prep instructions given to you by the office.    Take these medicines the morning of surgery with A SIP OF WATER          amlodipine, metoprolol, protonix.     Do not wear jewelry, make-up or nail polish.  Do not wear lotions, powders, or perfumes, or deodorant.  Do not shave 48 hours prior to surgery.  Men may shave face and neck.  Do not bring valuables to the hospital.  [facility] is not responsible for any belongings or valuables.  Contacts, dentures or bridgework may not be worn into surgery.  Leave your suitcase in the car.  After surgery it may be brought to your room.  For patients admitted to the hospital, discharge time will be determined by your treatment team.  Patients discharged the day of surgery will not be allowed to drive home and must have someone with them for 24 hours.        DO NOT smoke tobacco or vape for 24 hous before your procedure.   Please read over the following fact sheets that you were given. Anesthesia Post-op Instructions and Care and Recovery After Surgery      Upper Endoscopy, Adult, Care After This sheet gives you information about how to care for yourself after your procedure. Your health care provider may also give you more specific instructions. If you have problems or questions, contact your health care provider. What can I expect after the procedure? After the procedure, it is common to have: A sore throat. Mild stomach pain or discomfort. Bloating. Nausea. Follow these instructions at home:  Follow instructions from your health care provider about what to eat or drink after your procedure. Return to your normal activities as told by your health care provider.  Ask your health care provider what activities are safe for you. Take over-the-counter and prescription medicines only as told by your health care provider. If you were given a sedative during the procedure, it can affect you for several hours. Do not drive or operate machinery until your health care provider says that it is safe. Keep all follow-up visits as told by your health care provider. This is important. Contact a health care provider if you have: A sore throat that lasts longer than one day. Trouble swallowing. Get help right away if: You vomit blood or your vomit looks like coffee grounds. You have: A fever. Bloody, black, or tarry stools. A severe sore throat or you cannot swallow. Difficulty breathing. Severe pain in your chest or abdomen. Summary After the procedure, it is common to have a sore throat, mild stomach discomfort, bloating, and nausea. If you were given a sedative during the procedure, it can affect you for several hours. Do not drive or operate machinery until your health care provider says that it is safe. Follow instructions from your health care provider about what to eat or drink after your procedure. Return to your normal activities as told by your health care provider. This information is not intended to replace advice given to you by your health care provider. Make sure you discuss any questions  you have with your health care provider. Document Revised: 12/02/2018 Document Reviewed: 06/28/2017 Elsevier Patient Education  [place]. Esophageal Dilatation Esophageal dilatation, also called esophageal dilation, is a procedure to widen or open a blocked or narrowed part of the esophagus. The esophagus is the part of the body that moves food and liquid from the mouth to the stomach. You may need this procedure if: You have a buildup of scar tissue in your esophagus that makes it difficult, painful, or impossible to swallow. This can be caused by  gastroesophageal reflux disease (GERD). You have cancer of the esophagus. There is a problem with how food moves through your esophagus. In some cases, you may need this procedure repeated at a later time to dilate the esophagus gradually. Tell a health care provider about: Any allergies you have. All medicines you are taking, including vitamins, herbs, eye drops, creams, and over-the-counter medicines. Any problems you or family members have had with anesthetic medicines. Any blood disorders you have. Any surgeries you have had. Any medical conditions you have. Any antibiotic medicines you are required to take before dental procedures. Whether you are pregnant or may be pregnant. What are the risks? Generally, this is a safe procedure. However, problems may occur, including: Bleeding due to a tear in the lining of the esophagus. A hole, or perforation, in the esophagus. What happens before the procedure? Ask your health care provider about: Changing or stopping your regular medicines. This is especially important if you are taking diabetes medicines or blood thinners. Taking medicines such as aspirin and ibuprofen. These medicines can thin your blood. Do not take these medicines unless your health care provider tells you to take them. Taking over-the-counter medicines, vitamins, herbs, and supplements. Follow instructions from your health care provider about eating or drinking restrictions. Plan to have a responsible adult take you home from the hospital or clinic. Plan to have a responsible adult care for you for the time you are told after you leave the hospital or clinic. This is important. What happens during the procedure? You may be given a medicine to help you relax (sedative). A numbing medicine may be sprayed into the back of your throat, or you may gargle the medicine. Your health care provider may perform the dilatation using various surgical instruments, such as: Simple  dilators. This instrument is carefully placed in the esophagus to stretch it. Guided wire bougies. This involves using an endoscope to insert a wire into the esophagus. A dilator is passed over this wire to enlarge the esophagus. Then the wire is removed. Balloon dilators. An endoscope with a small balloon is inserted into the esophagus. The balloon is inflated to stretch the esophagus and open it up. The procedure may vary among health care providers and hospitals. What can I expect after the procedure? Your blood pressure, heart rate, breathing rate, and blood oxygen level will be monitored until you leave the hospital or clinic. Your throat may feel slightly sore and numb. This will get better over time. You will not be allowed to eat or drink until your throat is no longer numb. When you are able to drink, urinate, and sit on the edge of the bed without nausea or dizziness, you may be able to return home. Follow these instructions at home: Take over-the-counter and prescription medicines only as told by your health care provider. If you were given a sedative during the procedure, it can affect you for several hours. Do not drive or  operate machinery until your health care provider says that it is safe. Plan to have a responsible adult care for you for the time you are told. This is important. Follow instructions from your health care provider about any eating or drinking restrictions. Do not use any products that contain nicotine or tobacco, such as cigarettes, e-cigarettes, and chewing tobacco. If you need help quitting, ask your health care provider. Keep all follow-up visits. This is important. Contact a health care provider if: You have a fever. You have pain that is not relieved by medicine. Get help right away if: You have chest pain. You have trouble breathing. You have trouble swallowing. You vomit blood. You have black, tarry, or bloody stools. These symptoms may represent a  serious problem that is an emergency. Do not wait to see if the symptoms will go away. Get medical help right away. Call your local emergency services (911 in the [place] Do not drive yourself to the hospital. Summary Esophageal dilatation, also called esophageal dilation, is a procedure to widen or open a blocked or narrowed part of the esophagus. Plan to have a responsible adult take you home from the hospital or clinic. For this procedure, a numbing medicine may be sprayed into the back of your throat, or you may gargle the medicine. Do not drive or operate machinery until your health care provider says that it is safe. This information is not intended to replace advice given to you by your health care provider. Make sure you discuss any questions you have with your health care provider. Document Revised: 06/14/2019 Document Reviewed: 06/14/2019 Elsevier Patient Education  [place]. Colonoscopy, Adult, Care After The following information offers guidance on how to care for yourself after your procedure. Your health care provider may also give you more specific instructions. If you have problems or questions, contact your health care provider. What can I expect after the procedure? After the procedure, it is common to have: A small amount of blood in your stool for 24 hours after the procedure. Some gas. Mild cramping or bloating of your abdomen. Follow these instructions at home: Eating and drinking  Drink enough fluid to keep your urine pale yellow. Follow instructions from your health care provider about eating or drinking restrictions. Resume your normal diet as told by your health care provider. Avoid heavy or fried foods that are hard to digest. Activity Rest as told by your health care provider. Avoid sitting for a long time without moving. Get up to take short walks every 1-2 hours. This is important to improve blood flow and breathing. Ask for help if you feel weak or  unsteady. Return to your normal activities as told by your health care provider. Ask your health care provider what activities are safe for you. Managing cramping and bloating  Try walking around when you have cramps or feel bloated. If directed, apply heat to your abdomen as told by your health care provider. Use the heat source that your health care provider recommends, such as a moist heat pack or a heating pad. Place a towel between your skin and the heat source. Leave the heat on for 20-30 minutes. Remove the heat if your skin turns bright red. This is especially important if you are unable to feel pain, heat, or cold. You have a greater risk of getting burned. General instructions If you were given a sedative during the procedure, it can affect you for several hours. Do not drive or operate machinery  until your health care provider says that it is safe. For the first 24 hours after the procedure: Do not sign important documents. Do not drink alcohol. Do your regular daily activities at a slower pace than normal. Eat soft foods that are easy to digest. Take over-the-counter and prescription medicines only as told by your health care provider. Keep all follow-up visits. This is important. Contact a health care provider if: You have blood in your stool 2-3 days after the procedure. Get help right away if: You have more than a small spotting of blood in your stool. You have large blood clots in your stool. You have swelling of your abdomen. You have nausea or vomiting. You have a fever. You have increasing pain in your abdomen that is not relieved with medicine. These symptoms may be an emergency. Get help right away. Call 911. Do not wait to see if the symptoms will go away. Do not drive yourself to the hospital. Summary After the procedure, it is common to have a small amount of blood in your stool. You may also have mild cramping and bloating of your abdomen. If you were given a  sedative during the procedure, it can affect you for several hours. Do not drive or operate machinery until your health care provider says that it is safe. Get help right away if you have a lot of blood in your stool, nausea or vomiting, a fever, or increased pain in your abdomen. This information is not intended to replace advice given to you by your health care provider. Make sure you discuss any questions you have with your health care provider. Document Revised: 09/18/2020 Document Reviewed: 09/18/2020 Elsevier Patient Education  [place] After This sheet gives you information about how to care for yourself after your procedure. Your health care provider may also give you more specific instructions. If you have problems or questions, contact your health care provider. What can I expect after the procedure? After the procedure, it is common to have: Tiredness. Forgetfulness about what happened after the procedure. Impaired judgment for important decisions. Nausea or vomiting. Some difficulty with balance. Follow these instructions at home: For the time period you were told by your health care provider:     Rest as needed. Do not participate in activities where you could fall or become injured. Do not drive or use machinery. Do not drink alcohol. Do not take sleeping pills or medicines that cause drowsiness. Do not make important decisions or sign legal documents. Do not take care of children on your own. Eating and drinking Follow the diet that is recommended by your health care provider. Drink enough fluid to keep your urine pale yellow. If you vomit: Drink water, juice, or soup when you can drink without vomiting. Make sure you have little or no nausea before eating solid foods. General instructions Have a responsible adult stay with you for the time you are told. It is important to have someone help care for you until you are awake and  alert. Take over-the-counter and prescription medicines only as told by your health care provider. If you have sleep apnea, surgery and certain medicines can increase your risk for breathing problems. Follow instructions from your health care provider about wearing your sleep device: Anytime you are sleeping, including during daytime naps. While taking prescription pain medicines, sleeping medicines, or medicines that make you drowsy. Avoid smoking. Keep all follow-up visits as told by your health care provider.  This is important. Contact a health care provider if: You keep feeling nauseous or you keep vomiting. You feel light-headed. You are still sleepy or having trouble with balance after 24 hours. You develop a rash. You have a fever. You have redness or swelling around the IV site. Get help right away if: You have trouble breathing. You have new-onset confusion at home. Summary For several hours after your procedure, you may feel tired. You may also be forgetful and have poor judgment. Have a responsible adult stay with you for the time you are told. It is important to have someone help care for you until you are awake and alert. Rest as told. Do not drive or operate machinery. Do not drink alcohol or take sleeping pills. Get help right away if you have trouble breathing, or if you suddenly become confused. This information is not intended to replace advice given to you by your health care provider. Make sure you discuss any questions you have with your health care provider. Document Revised: 12/31/2020 Document Reviewed: 12/29/2018 Elsevier Patient Education  [place].

## 2021-07-22 ENCOUNTER — Encounter (HOSPITAL_COMMUNITY): Payer: PPO

## 2021-07-22 ENCOUNTER — Encounter (HOSPITAL_COMMUNITY)
Admission: RE | Admit: 2021-07-22 | Discharge: 2021-07-22 | Disposition: A | Discharge status: Home / Self Care | Payer: PPO | Source: Ambulatory Visit | Attending: Internal Medicine | Admitting: Internal Medicine

## 2021-07-22 ENCOUNTER — Telehealth: Payer: Self-pay | Admitting: *Deleted

## 2021-07-22 ENCOUNTER — Encounter (HOSPITAL_COMMUNITY): Payer: Self-pay

## 2021-07-22 DIAGNOSIS — I10 Essential (primary) hypertension: Secondary | ICD-10-CM

## 2021-07-22 DIAGNOSIS — Z79899 Other long term (current) drug therapy: Secondary | ICD-10-CM

## 2021-07-22 DIAGNOSIS — D649 Anemia, unspecified: Secondary | ICD-10-CM

## 2021-07-22 NOTE — Telephone Encounter (Signed)
-----   Message from [name] sent at 07/22/2021  2:50 PM EDT ----- Beverly Young,  This patient was on for Friday.  She called and said she is running a fever and needs to cancel.  I have pulled it into the depot, just let me know what to do with it.  Thanks,

## 2021-07-22 NOTE — Telephone Encounter (Signed)
Called pt. She stated she had a tick on her for 4 days and believes this is what causing fever. She has cancelled for Friday. Aware will call to r/s once we receive future schedule.

## 2021-07-25 ENCOUNTER — Ambulatory Visit (HOSPITAL_COMMUNITY): Admission: RE | Admit: 2021-07-25 | Payer: PPO | Source: Home / Self Care | Admitting: Internal Medicine

## 2021-07-25 ENCOUNTER — Encounter (HOSPITAL_COMMUNITY): Admission: RE | Payer: Self-pay | Source: Home / Self Care

## 2021-07-25 SURGERY — COLONOSCOPY WITH PROPOFOL
Anesthesia: Monitor Anesthesia Care

## 2021-08-07 NOTE — Telephone Encounter (Signed)
LMOVM to call back 

## 2021-08-08 DIAGNOSIS — I10 Essential (primary) hypertension: Secondary | ICD-10-CM | POA: Diagnosis not present

## 2021-08-08 DIAGNOSIS — E782 Mixed hyperlipidemia: Secondary | ICD-10-CM | POA: Diagnosis not present

## 2021-08-08 DIAGNOSIS — K219 Gastro-esophageal reflux disease without esophagitis: Secondary | ICD-10-CM | POA: Diagnosis not present

## 2021-08-18 ENCOUNTER — Encounter: Payer: Self-pay | Admitting: *Deleted

## 2021-08-19 ENCOUNTER — Encounter: Payer: Self-pay | Admitting: *Deleted

## 2021-08-19 NOTE — Telephone Encounter (Signed)
Spoke with pt. Scheduled for 8/25 at 7:30am. She already has her prep at home. Will mail new instructions/pre-op appt.

## 2021-09-08 DIAGNOSIS — K219 Gastro-esophageal reflux disease without esophagitis: Secondary | ICD-10-CM | POA: Diagnosis not present

## 2021-09-08 DIAGNOSIS — E782 Mixed hyperlipidemia: Secondary | ICD-10-CM | POA: Diagnosis not present

## 2021-09-08 DIAGNOSIS — I10 Essential (primary) hypertension: Secondary | ICD-10-CM | POA: Diagnosis not present

## 2021-09-17 DIAGNOSIS — L821 Other seborrheic keratosis: Secondary | ICD-10-CM | POA: Diagnosis not present

## 2021-09-17 DIAGNOSIS — Z08 Encounter for follow-up examination after completed treatment for malignant neoplasm: Secondary | ICD-10-CM | POA: Diagnosis not present

## 2021-09-17 DIAGNOSIS — Z85828 Personal history of other malignant neoplasm of skin: Secondary | ICD-10-CM | POA: Diagnosis not present

## 2021-09-30 NOTE — Patient Instructions (Signed)
Beverly Young  09/30/2021     '@PREFPERIOPPHARMACY'$ @   Your procedure is scheduled on  10/03/2021.   Report to [facility] at  0600 A.M.   Call this number if you have problems the morning of surgery:  [phone]   Remember:  Follow the diet and prep instructions given to you by the office.     Take these medicines the morning of surgery with A SIP OF WATER                              norvasc, toprol, protonix.     Do not wear jewelry, make-up or nail polish.  Do not wear lotions, powders, or perfumes, or deodorant.  Do not shave 48 hours prior to surgery.  Men may shave face and neck.  Do not bring valuables to the hospital.  [facility] is not responsible for any belongings or valuables.  Contacts, dentures or bridgework may not be worn into surgery.  Leave your suitcase in the car.  After surgery it may be brought to your room.  For patients admitted to the hospital, discharge time will be determined by your treatment team.  Patients discharged the day of surgery will not be allowed to drive home and must have someone with them for 24 hours.    Special instructions:   DO NOT smoke tobacco or vape for 24 hours before your procedure.  Please read over the following fact sheets that you were given. Anesthesia Post-op Instructions and Care and Recovery After Surgery      Upper Endoscopy, Adult, Care After After the procedure, it is common to have a sore throat. It is also common to have: Mild stomach pain or discomfort. Bloating. Nausea. Follow these instructions at home: The instructions below may help you care for yourself at home. Your health care provider may give you more instructions. If you have questions, ask your health care provider. If you were given a sedative during the procedure, it can affect you for several hours. Do not drive or operate machinery until your health care provider says that it is safe. If you will be going home right after  the procedure, plan to have a responsible adult: Take you home from the hospital or clinic. You will not be allowed to drive. Care for you for the time you are told. Follow instructions from your health care provider about what you may eat and drink. Return to your normal activities as told by your health care provider. Ask your health care provider what activities are safe for you. Take over-the-counter and prescription medicines only as told by your health care provider. Contact a health care provider if you: Have a sore throat that lasts longer than one day. Have trouble swallowing. Have a fever. Get help right away if you: Vomit blood or your vomit looks like coffee grounds. Have bloody, black, or tarry stools. Have a very bad sore throat or you cannot swallow. Have difficulty breathing or very bad pain in your chest or abdomen. These symptoms may be an emergency. Get help right away. Call 911. Do not wait to see if the symptoms will go away. Do not drive yourself to the hospital. Summary After the procedure, it is common to have a sore throat, mild stomach discomfort, bloating, and nausea. If you were given a sedative during the procedure, it can affect you for several  hours. Do not drive until your health care provider says that it is safe. Follow instructions from your health care provider about what you may eat and drink. Return to your normal activities as told by your health care provider. This information is not intended to replace advice given to you by your health care provider. Make sure you discuss any questions you have with your health care provider. Document Revised: 05/07/2021 Document Reviewed: 05/07/2021 Elsevier Patient Education  [place]. Colonoscopy, Adult, Care After The following information offers guidance on how to care for yourself after your procedure. Your health care provider may also give you more specific instructions. If you have problems or  questions, contact your health care provider. What can I expect after the procedure? After the procedure, it is common to have: A small amount of blood in your stool for 24 hours after the procedure. Some gas. Mild cramping or bloating of your abdomen. Follow these instructions at home: Eating and drinking  Drink enough fluid to keep your urine pale yellow. Follow instructions from your health care provider about eating or drinking restrictions. Resume your normal diet as told by your health care provider. Avoid heavy or fried foods that are hard to digest. Activity Rest as told by your health care provider. Avoid sitting for a long time without moving. Get up to take short walks every 1-2 hours. This is important to improve blood flow and breathing. Ask for help if you feel weak or unsteady. Return to your normal activities as told by your health care provider. Ask your health care provider what activities are safe for you. Managing cramping and bloating  Try walking around when you have cramps or feel bloated. If directed, apply heat to your abdomen as told by your health care provider. Use the heat source that your health care provider recommends, such as a moist heat pack or a heating pad. Place a towel between your skin and the heat source. Leave the heat on for 20-30 minutes. Remove the heat if your skin turns bright red. This is especially important if you are unable to feel pain, heat, or cold. You have a greater risk of getting burned. General instructions If you were given a sedative during the procedure, it can affect you for several hours. Do not drive or operate machinery until your health care provider says that it is safe. For the first 24 hours after the procedure: Do not sign important documents. Do not drink alcohol. Do your regular daily activities at a slower pace than normal. Eat soft foods that are easy to digest. Take over-the-counter and prescription medicines  only as told by your health care provider. Keep all follow-up visits. This is important. Contact a health care provider if: You have blood in your stool 2-3 days after the procedure. Get help right away if: You have more than a small spotting of blood in your stool. You have large blood clots in your stool. You have swelling of your abdomen. You have nausea or vomiting. You have a fever. You have increasing pain in your abdomen that is not relieved with medicine. These symptoms may be an emergency. Get help right away. Call 911. Do not wait to see if the symptoms will go away. Do not drive yourself to the hospital. Summary After the procedure, it is common to have a small amount of blood in your stool. You may also have mild cramping and bloating of your abdomen. If you were given a  sedative during the procedure, it can affect you for several hours. Do not drive or operate machinery until your health care provider says that it is safe. Get help right away if you have a lot of blood in your stool, nausea or vomiting, a fever, or increased pain in your abdomen. This information is not intended to replace advice given to you by your health care provider. Make sure you discuss any questions you have with your health care provider. Document Revised: 09/18/2020 Document Reviewed: 09/18/2020 Elsevier Patient Education  [place] After This sheet gives you information about how to care for yourself after your procedure. Your health care provider may also give you more specific instructions. If you have problems or questions, contact your health care provider. What can I expect after the procedure? After the procedure, it is common to have: Tiredness. Forgetfulness about what happened after the procedure. Impaired judgment for important decisions. Nausea or vomiting. Some difficulty with balance. Follow these instructions at home: For the time period  you were told by your health care provider:     Rest as needed. Do not participate in activities where you could fall or become injured. Do not drive or use machinery. Do not drink alcohol. Do not take sleeping pills or medicines that cause drowsiness. Do not make important decisions or sign legal documents. Do not take care of children on your own. Eating and drinking Follow the diet that is recommended by your health care provider. Drink enough fluid to keep your urine pale yellow. If you vomit: Drink water, juice, or soup when you can drink without vomiting. Make sure you have little or no nausea before eating solid foods. General instructions Have a responsible adult stay with you for the time you are told. It is important to have someone help care for you until you are awake and alert. Take over-the-counter and prescription medicines only as told by your health care provider. If you have sleep apnea, surgery and certain medicines can increase your risk for breathing problems. Follow instructions from your health care provider about wearing your sleep device: Anytime you are sleeping, including during daytime naps. While taking prescription pain medicines, sleeping medicines, or medicines that make you drowsy. Avoid smoking. Keep all follow-up visits as told by your health care provider. This is important. Contact a health care provider if: You keep feeling nauseous or you keep vomiting. You feel light-headed. You are still sleepy or having trouble with balance after 24 hours. You develop a rash. You have a fever. You have redness or swelling around the IV site. Get help right away if: You have trouble breathing. You have new-onset confusion at home. Summary For several hours after your procedure, you may feel tired. You may also be forgetful and have poor judgment. Have a responsible adult stay with you for the time you are told. It is important to have someone help care for  you until you are awake and alert. Rest as told. Do not drive or operate machinery. Do not drink alcohol or take sleeping pills. Get help right away if you have trouble breathing, or if you suddenly become confused. This information is not intended to replace advice given to you by your health care provider. Make sure you discuss any questions you have with your health care provider. Document Revised: 12/31/2020 Document Reviewed: 12/29/2018 Elsevier Patient Education  [place].

## 2021-10-01 ENCOUNTER — Encounter (HOSPITAL_COMMUNITY): Payer: Self-pay

## 2021-10-01 ENCOUNTER — Other Ambulatory Visit (HOSPITAL_COMMUNITY): Payer: PPO

## 2021-10-01 ENCOUNTER — Encounter (HOSPITAL_COMMUNITY)
Admission: RE | Admit: 2021-10-01 | Discharge: 2021-10-01 | Disposition: A | Discharge status: Home / Self Care | Payer: PPO | Source: Ambulatory Visit | Attending: Internal Medicine | Admitting: Internal Medicine

## 2021-10-01 VITALS — BP 156/64 | HR 66 | Resp 18 | Ht 64.5 in | Wt 270.0 lb

## 2021-10-01 DIAGNOSIS — D631 Anemia in chronic kidney disease: Secondary | ICD-10-CM | POA: Diagnosis not present

## 2021-10-01 DIAGNOSIS — N1831 Chronic kidney disease, stage 3a: Secondary | ICD-10-CM | POA: Insufficient documentation

## 2021-10-01 DIAGNOSIS — Z01818 Encounter for other preprocedural examination: Secondary | ICD-10-CM | POA: Diagnosis not present

## 2021-10-01 DIAGNOSIS — I10 Essential (primary) hypertension: Secondary | ICD-10-CM | POA: Diagnosis not present

## 2021-10-01 DIAGNOSIS — R9431 Abnormal electrocardiogram [ECG] [EKG]: Secondary | ICD-10-CM | POA: Insufficient documentation

## 2021-10-01 DIAGNOSIS — Z79899 Other long term (current) drug therapy: Secondary | ICD-10-CM | POA: Diagnosis not present

## 2021-10-01 LAB — CBC WITH DIFFERENTIAL/PLATELET
Abs Immature Granulocytes: 0.03 10*3/uL (ref 0.00–0.07)
Basophils Absolute: 0.1 10*3/uL (ref 0.0–0.1)
Basophils Relative: 1 %
Eosinophils Absolute: 0.3 10*3/uL (ref 0.0–0.5)
Eosinophils Relative: 3 %
HCT: 38.2 % (ref 36.0–46.0)
Hemoglobin: 12.7 g/dL (ref 12.0–15.0)
Immature Granulocytes: 0 %
Lymphocytes Relative: 27 %
Lymphs Abs: 2.3 10*3/uL (ref 0.7–4.0)
MCH: 29.5 pg (ref 26.0–34.0)
MCHC: 33.2 g/dL (ref 30.0–36.0)
MCV: 88.6 fL (ref 80.0–100.0)
Monocytes Absolute: 0.8 10*3/uL (ref 0.1–1.0)
Monocytes Relative: 10 %
Neutro Abs: 5 10*3/uL (ref 1.7–7.7)
Neutrophils Relative %: 59 %
Platelets: 181 10*3/uL (ref 150–400)
RBC: 4.31 MIL/uL (ref 3.87–5.11)
RDW: 13.2 % (ref 11.5–15.5)
WBC: 8.5 10*3/uL (ref 4.0–10.5)
nRBC: 0 % (ref 0.0–0.2)

## 2021-10-01 LAB — BASIC METABOLIC PANEL
Anion gap: 4 — ABNORMAL LOW (ref 5–15)
BUN: 32 mg/dL — ABNORMAL HIGH (ref 8–23)
CO2: 29 mmol/L (ref 22–32)
Calcium: 9.7 mg/dL (ref 8.9–10.3)
Chloride: 109 mmol/L (ref 98–111)
Creatinine, Ser: 1.12 mg/dL — ABNORMAL HIGH (ref 0.44–1.00)
GFR, Estimated: 53 mL/min — ABNORMAL LOW (ref >60)
Glucose, Bld: 114 mg/dL — ABNORMAL HIGH (ref 70–99)
Potassium: 4.2 mmol/L (ref 3.5–5.1)
Sodium: 142 mmol/L (ref 135–145)

## 2021-10-03 ENCOUNTER — Ambulatory Visit (HOSPITAL_COMMUNITY)
Admission: RE | Admit: 2021-10-03 | Discharge: 2021-10-03 | Disposition: A | Discharge status: Home / Self Care | Payer: PPO | Attending: Internal Medicine | Admitting: Internal Medicine

## 2021-10-03 ENCOUNTER — Encounter (HOSPITAL_COMMUNITY)
Admission: RE | Disposition: A | Discharge status: Home / Self Care | Payer: Self-pay | Source: Home / Self Care | Attending: Internal Medicine

## 2021-10-03 ENCOUNTER — Ambulatory Visit (HOSPITAL_COMMUNITY): Payer: PPO | Admitting: Certified Registered"

## 2021-10-03 ENCOUNTER — Telehealth: Payer: Self-pay

## 2021-10-03 ENCOUNTER — Ambulatory Visit (HOSPITAL_BASED_OUTPATIENT_CLINIC_OR_DEPARTMENT_OTHER): Payer: PPO | Admitting: Certified Registered"

## 2021-10-03 ENCOUNTER — Encounter (HOSPITAL_COMMUNITY): Payer: Self-pay | Admitting: Internal Medicine

## 2021-10-03 DIAGNOSIS — I10 Essential (primary) hypertension: Secondary | ICD-10-CM | POA: Diagnosis not present

## 2021-10-03 DIAGNOSIS — K222 Esophageal obstruction: Secondary | ICD-10-CM

## 2021-10-03 DIAGNOSIS — K449 Diaphragmatic hernia without obstruction or gangrene: Secondary | ICD-10-CM

## 2021-10-03 DIAGNOSIS — Z6841 Body Mass Index (BMI) 40.0 and over, adult: Secondary | ICD-10-CM | POA: Insufficient documentation

## 2021-10-03 DIAGNOSIS — K2100 Gastro-esophageal reflux disease with esophagitis, without bleeding: Secondary | ICD-10-CM | POA: Insufficient documentation

## 2021-10-03 DIAGNOSIS — R1310 Dysphagia, unspecified: Secondary | ICD-10-CM | POA: Insufficient documentation

## 2021-10-03 DIAGNOSIS — K5730 Diverticulosis of large intestine without perforation or abscess without bleeding: Secondary | ICD-10-CM | POA: Insufficient documentation

## 2021-10-03 DIAGNOSIS — Z1211 Encounter for screening for malignant neoplasm of colon: Secondary | ICD-10-CM | POA: Insufficient documentation

## 2021-10-03 HISTORY — PX: ESOPHAGOGASTRODUODENOSCOPY (EGD) WITH PROPOFOL: SHX5813

## 2021-10-03 HISTORY — PX: MALONEY DILATION: SHX5535

## 2021-10-03 HISTORY — PX: COLONOSCOPY WITH PROPOFOL: SHX5780

## 2021-10-03 SURGERY — COLONOSCOPY WITH PROPOFOL
Anesthesia: Monitor Anesthesia Care

## 2021-10-03 MED ORDER — LACTATED RINGERS IV SOLN: INTRAVENOUS | Status: DC

## 2021-10-03 MED ORDER — GLYCOPYRROLATE 0.2 MG/ML IJ SOLN
INTRAMUSCULAR | Status: DC | PRN
  Administered 2021-10-03: .1 mg via INTRAVENOUS

## 2021-10-03 MED ORDER — PANTOPRAZOLE SODIUM 40 MG PO TBEC: 40.0000 mg | DELAYED_RELEASE_TABLET | Freq: Two times a day (BID) | ORAL | 3 refills | Status: AC

## 2021-10-03 MED ORDER — PROPOFOL 10 MG/ML IV BOLUS
INTRAVENOUS | Status: DC | PRN
  Administered 2021-10-03: 150 ug/kg/min via INTRAVENOUS

## 2021-10-03 MED ORDER — LIDOCAINE HCL (CARDIAC) PF 100 MG/5ML IV SOSY
PREFILLED_SYRINGE | INTRAVENOUS | Status: DC | PRN
  Administered 2021-10-03: 50 mg via INTRAVENOUS

## 2021-10-03 MED ORDER — GLYCOPYRROLATE PF 0.2 MG/ML IJ SOSY
PREFILLED_SYRINGE | INTRAMUSCULAR | Status: AC
  Filled 2021-10-03: qty 1

## 2021-10-03 MED ORDER — PROPOFOL 500 MG/50ML IV EMUL
INTRAVENOUS | Status: AC
  Filled 2021-10-03: qty 50

## 2021-10-03 MED ORDER — LIDOCAINE HCL (PF) 2 % IJ SOLN
INTRAMUSCULAR | Status: AC
  Filled 2021-10-03: qty 5

## 2021-10-03 NOTE — Discharge Instructions (Addendum)
Colonoscopy Discharge Instructions  Read the instructions outlined below and refer to this sheet in the next few weeks. These discharge instructions provide you with general information on caring for yourself after you leave the hospital. Your doctor may also give you specific instructions. While your treatment has been planned according to the most current medical practices available, unavoidable complications occasionally occur. If you have any problems or questions after discharge, call Dr. Gala Romney at [phone]. ACTIVITY You may resume your regular activity, but move at a slower pace for the next 24 hours.  Take frequent rest periods for the next 24 hours.  Walking will help get rid of the air and reduce the bloated feeling in your belly (abdomen).  No driving for 24 hours (because of the medicine (anesthesia) used during the test).   Do not sign any important legal documents or operate any machinery for 24 hours (because of the anesthesia used during the test).  NUTRITION Drink plenty of fluids.  You may resume your normal diet as instructed by your doctor.  Begin with a light meal and progress to your normal diet. Heavy or fried foods are harder to digest and may make you feel sick to your stomach (nauseated).  Avoid alcoholic beverages for 24 hours or as instructed.  MEDICATIONS You may resume your normal medications unless your doctor tells you otherwise.  WHAT YOU CAN EXPECT TODAY Some feelings of bloating in the abdomen.  Passage of more gas than usual.  Spotting of blood in your stool or on the toilet paper.  IF YOU HAD POLYPS REMOVED DURING THE COLONOSCOPY: No aspirin products for 7 days or as instructed.  No alcohol for 7 days or as instructed.  Eat a soft diet for the next 24 hours.  FINDING OUT THE RESULTS OF YOUR TEST Not all test results are available during your visit. If your test results are not back during the visit, make an appointment with your caregiver to find out the  results. Do not assume everything is normal if you have not heard from your caregiver or the medical facility. It is important for you to follow up on all of your test results.  SEEK IMMEDIATE MEDICAL ATTENTION IF: You have more than a spotting of blood in your stool.  Your belly is swollen (abdominal distention).  You are nauseated or vomiting.  You have a temperature over 101.  You have abdominal pain or discomfort that is severe or gets worse throughout the day.   EGD Discharge instructions Please read the instructions outlined below and refer to this sheet in the next few weeks. These discharge instructions provide you with general information on caring for yourself after you leave the hospital. Your doctor may also give you specific instructions. While your treatment has been planned according to the most current medical practices available, unavoidable complications occasionally occur. If you have any problems or questions after discharge, please call your doctor. ACTIVITY You may resume your regular activity but move at a slower pace for the next 24 hours.  Take frequent rest periods for the next 24 hours.  Walking will help expel (get rid of) the air and reduce the bloated feeling in your abdomen.  No driving for 24 hours (because of the anesthesia (medicine) used during the test).  You may shower.  Do not sign any important legal documents or operate any machinery for 24 hours (because of the anesthesia used during the test).  NUTRITION Drink plenty of fluids.  You may  resume your normal diet.  Begin with a light meal and progress to your normal diet.  Avoid alcoholic beverages for 24 hours or as instructed by your caregiver.  MEDICATIONS You may resume your normal medications unless your caregiver tells you otherwise.  WHAT YOU CAN EXPECT TODAY You may experience abdominal discomfort such as a feeling of fullness or "gas" pains.  FOLLOW-UP Your doctor will discuss the results of  your test with you.  SEEK IMMEDIATE MEDICAL ATTENTION IF ANY OF THE FOLLOWING OCCUR: Excessive nausea (feeling sick to your stomach) and/or vomiting.  Severe abdominal pain and distention (swelling).  Trouble swallowing.  Temperature over 101 F (37.8 C).  Rectal bleeding or vomiting of blood.      you have reflux esophagitis and a narrowing in your esophagus.  Your esophagus was stretched today   GERD information provided  Increase Protonix to 40 mg 30 minutes before breakfast and supper.  New prescription will be called in through my office.    Colonoscopy revealed only diverticulosis; no polyps   a future colonoscopy is not recommended unless new symptoms arise   office visit with [place] in 3 months   at patient request, I called Houston Siren at [phone] findings and recommendations

## 2021-10-03 NOTE — Telephone Encounter (Signed)
-----   Message from Daneil Dolin, MD sent at 10/03/2021  8:20 AM EDT -----  new prescription.  Protonix 40 mg 30 minutes before breakfast and supper.  Twice daily dispense  60 with 3 refills.

## 2021-10-03 NOTE — H&P (Signed)
$'@LOGO'H$ @   Primary Care Physician:  Celene Squibb, MD Primary Gastroenterologist:  Dr. Gala Romney  Pre-Procedure History & Physical: HPI:  Beverly Young is a 71 y.o. female here for further evaluation of esophageal dysphagia and history of known GERD with peptic stricture and screening colonoscopy.  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   Hemorrhoids    HTN (hypertension)    Hyperlipidemia    Morbid obesity with BMI of 45.0-49.9, adult (HCC)    Obesity    Peptic stricture of esophagus    last dilated 07/2007    Past Surgical History:  Procedure Laterality Date   CESAREAN SECTION     COLONOSCOPY  07/11/2007   external hemorrhoidal otherwise normal rectum pancolonic diverticula, suboptimal prep on the right (Next 07/2012)   ESOPHAGOGASTRODUODENOSCOPY  07/11/2007   circumferential distal esophageal ersionc/soft peptic ring dilated 33F/small hiatal herina   KNEE SURGERY     right   NECK SURGERY  2002   ruptured disk   PARTIAL HYSTERECTOMY      Prior to Admission medications   Medication Sig Start Date End Date Taking? Authorizing Provider  amLODipine (NORVASC) 5 MG tablet Take 5 mg by mouth daily.  07/01/13  Yes [provider]  atorvastatin (LIPITOR) 40 MG tablet Take 40 mg by mouth at bedtime. 09/05/19  Yes [provider]  Calcium Carbonate-Vitamin D (CALCIUM-VITAMIN D) 500-200 MG-UNIT per tablet Take 1 tablet by mouth daily.   Yes [provider]  Glucosamine Sulfate 1000 MG CAPS Take 1 capsule by mouth daily.   Yes [provider]  hydrochlorothiazide (HYDRODIURIL) 12.5 MG tablet Take 12.5 mg by mouth every morning. 09/05/19  Yes [provider]  magnesium oxide (MAG-OX) 400 (240 Mg) MG tablet Take 400 mg by mouth daily.   Yes [provider]  metoprolol succinate (TOPROL-XL) 50 MG 24 hr tablet Take 50 mg by mouth daily. 06/21/17  Yes [provider]  multivitamin-iron-minerals-folic acid (CENTRUM) chewable tablet Chew 1 tablet by mouth daily.   Yes [provider]  naproxen (NAPROSYN) 250 MG tablet Take 250 mg by mouth every other day. 12/06/19  Yes [provider]  olmesartan (BENICAR) 40 MG tablet Take 40 mg by mouth daily. 04/24/21  Yes [provider]  pantoprazole (PROTONIX) 40 MG tablet Take 40 mg by mouth daily. 04/24/21  Yes [provider]  polyethylene glycol-electrolytes (TRILYTE) 420 g solution Take 4,000 mLs by mouth as directed. 06/23/21  Yes Mikeisha Lemonds, Cristopher Estimable, MD  Potassium Gluconate 550 MG TABS Take 550 mg by mouth daily.   Yes [provider]  vitamin C (ASCORBIC ACID) 500 MG tablet Take 500 mg by mouth daily.   Yes [provider]  zinc gluconate 50 MG tablet Take 50 mg by mouth daily.   Yes [provider]  nitroGLYCERIN (NITROSTAT) 0.4 MG SL tablet Place 1 tablet (0.4 mg total) under the tongue every 5 (five) minutes as needed for chest pain (or chest tightness or heaviness or pressure). 03[birth year]/49   Delora Fuel, MD    Allergies as of 08/19/2021 - Review Complete 07/16/2021  Allergen Reaction Noted   Codeine Other (See Comments) 01/15/2011    Family History  Problem Relation Age of Onset   Heart disease Mother    Colon cancer Father 38   Colon polyps Sister    Heart disease Sister    Cirrhosis Brother  etoh   Liver cancer Paternal Aunt     Social History   Socioeconomic History   Marital status: Widowed    Spouse name: Not on file   Number of children: Not on file   Years of education: Not on file   Highest education level: Not on file  Occupational History   Not on file  Tobacco Use   Smoking status: Never   Smokeless tobacco: Never  Vaping Use   Vaping Use: Never used  Substance and Sexual Activity   Alcohol use: No   Drug use: No   Sexual activity: Not Currently  Other Topics Concern   Not on file  Social History  Narrative   Not on file   Social Determinants of Health   Financial Resource Strain: Not on file  Food Insecurity: Not on file  Transportation Needs: Not on file  Physical Activity: Not on file  Stress: Not on file  Social Connections: Not on file  Intimate Partner Violence: Not on file    Review of Systems: See HPI, otherwise negative ROS  Physical Exam: BP (!) 169/79   Pulse 66   Temp 97.9 F (36.6 C) (Oral)   Resp 16   Ht 5' 4.5" (1.638 m)   Wt 122.5 kg   SpO2 98%   BMI 45.63 kg/m  General:   Alert,  Well-developed, well-nourished, pleasant and cooperative in NAD Neck:  Supple; no masses or thyromegaly. No significant cervical adenopathy. Lungs:  Clear throughout to auscultation.   No wheezes, crackles, or rhonchi. No acute distress. Heart:  Regular rate and rhythm; no murmurs, clicks, rubs,  or gallops. Abdomen: Non-distended, normal bowel sounds.  Soft and nontender without appreciable mass or hepatosplenomegaly.  Pulses:  Normal pulses noted. Extremities:  Without clubbing or edema.  Impression/Plan: 71 year old lady here for further evaluation of longstanding GERD esophageal dysphagia along with colorectal cancer screening per plan. GERD reportedly well controlled on once daily PPI.  The risks, benefits, limitations, imponderables and alternatives regarding both EGD and colonoscopy have been reviewed with the patient. Questions have been answered. All parties agreeable.      Notice: This dictation was prepared with Dragon dictation along with smaller phrase technology. Any transcriptional errors that result from this process are unintentional and may not be corrected upon review.

## 2021-10-03 NOTE — Op Note (Signed)
[facility] Patient Name: Beverly Young Procedure Date: 10/03/2021 7:50 AM MRN: [id number] Date of Birth: Oct 30, 1950 Attending MD: Norvel Richards , MD CSN: [id number] Age: 71 Admit Type: Outpatient Procedure:                Colonoscopy Indications:              Screening for colorectal malignant neoplasm Providers:                Norvel Richards, MD, Janeece Riggers, RN, Caprice Kluver Referring MD:              Medicines:                Propofol per Anesthesia Complications:            No immediate complications. Estimated Blood Loss:     Estimated blood loss: none. Procedure:                Pre-Anesthesia Assessment:                           - Prior to the procedure, a History and Physical                            was performed, and patient medications and                            allergies were reviewed. The patient's tolerance of                            previous anesthesia was also reviewed. The risks                            and benefits of the procedure and the sedation                            options and risks were discussed with the patient.                            All questions were answered, and informed consent                            was obtained. Prior Anticoagulants: The patient has                            taken no previous anticoagulant or antiplatelet                            agents. ASA Grade Assessment: III - A patient with                            severe systemic disease. After reviewing the risks  and benefits, the patient was deemed in                            satisfactory condition to undergo the procedure.                           After obtaining informed consent, the colonoscope                            was passed under direct vision. Throughout the                            procedure, the patient's blood pressure, pulse, and                            oxygen saturations  were monitored continuously. The                            [id number]) scope was introduced through the                            anus and advanced to the the cecum, identified by                            appendiceal orifice and ileocecal valve. Scope In: 7:54:25 AM Scope Out: 8:05:54 AM Scope Withdrawal Time: 0 hours 8 minutes 25 seconds  Total Procedure Duration: 0 hours 11 minutes 29 seconds  Findings:      Scattered diverticula were found in the entire colon.      The exam was otherwise without abnormality on direct and retroflexion       views. Impression:               - Diverticulosis in the entire examined colon.                           - The examination was otherwise normal on direct                            and retroflexion views.                           - No specimens collected. Moderate Sedation:      Moderate (conscious) sedation was personally administered by an       anesthesia professional. The following parameters were monitored: oxygen       saturation, heart rate, blood pressure, respiratory rate, EKG, adequacy       of pulmonary ventilation, and response to care. Total physician       intraservice time was 15 minutes. Recommendation:           - Patient has a contact number available for                            emergencies. The signs and symptoms of potential  delayed complications were discussed with the                            patient. Return to normal activities tomorrow.                            Written discharge instructions were provided to the                            patient.                           - Advance diet as tolerated.                           - No repeat colonoscopy due to age.                           - Return to GI clinic in 3 months. Procedure Code(s):        --- Professional ---                           [phone], Colonoscopy, flexible; diagnostic, including                             collection of specimen(s) by brushing or washing,                            when performed (separate procedure) Diagnosis Code(s):        --- Professional ---                           Z12.11, Encounter for screening for malignant                            neoplasm of colon                           K57.30, Diverticulosis of large intestine without                            perforation or abscess without bleeding CPT copyright 2019 [employer]. All rights reserved. The codes documented in this report are preliminary and upon coder review may  be revised to meet current compliance requirements. Cristopher Estimable. Avarae Zwart, MD Norvel Richards, MD 10/03/2021 8:13:53 AM This report has been signed electronically. Number of Addenda: 0

## 2021-10-03 NOTE — Op Note (Signed)
[facility] Patient Name: Beverly Young Procedure Date: 10/03/2021 7:07 AM MRN: [id number] Date of Birth: 03/09/1950 Attending MD: Norvel Richards , MD CSN: [id number] Age: 71 Admit Type: Outpatient Procedure:                Upper GI endoscopy Indications:              Dysphagia Providers:                Norvel Richards, MD, Janeece Riggers, RN, Caprice Kluver Referring MD:              Medicines:                Propofol per Anesthesia Complications:            No immediate complications. Estimated Blood Loss:     Estimated blood loss was minimal. Procedure:                Pre-Anesthesia Assessment:                           - Prior to the procedure, a History and Physical                            was performed, and patient medications and                            allergies were reviewed. The patient's tolerance of                            previous anesthesia was also reviewed. The risks                            and benefits of the procedure and the sedation                            options and risks were discussed with the patient.                            All questions were answered, and informed consent                            was obtained. Prior Anticoagulants: The patient has                            taken no previous anticoagulant or antiplatelet                            agents. ASA Grade Assessment: III - A patient with                            severe systemic disease. After reviewing the risks  and benefits, the patient was deemed in                            satisfactory condition to undergo the procedure.                           After obtaining informed consent, the endoscope was                            passed under direct vision. Throughout the                            procedure, the patient's blood pressure, pulse, and                            oxygen saturations were monitored  continuously. The                            [id number]-[id number] ([id number]) scope was introduced through the                            mouth, and advanced to the second part of duodenum.                            The upper GI endoscopy was accomplished without                            difficulty. The patient tolerated the procedure                            well. Scope In: 7:41:03 AM Scope Out: 6:96:29 AM Total Procedure Duration: 0 hours 6 minutes 15 seconds  Findings:      A low-grade of narrowing Schatzki ring was found at the gastroesophageal       junction. Multiple 5 mm erosion straddling the GE junction. No tumor. No       Barrett's epithelium.      A medium-sized hiatal hernia was present. Couple of tiny antral erosions       otherwise, normal-appearing gastric mucosa. Pylorus patent.      The duodenal bulb and second portion of the duodenum were normal. The       scope was withdrawn. Dilation was performed with a Maloney dilator with       no resistance at 35 Fr. The scope was withdrawn. Dilation was performed       with a Maloney dilator with mild resistance at 56 Fr. The dilation site       was examined following endoscope reinsertion and showed moderate mucosal       disruption. Estimated blood loss was minimal. Impression:               - Low-grade of narrowing Schatzki ring. Dilated.                            Erosive reflux esophagitis                           -  Medium-sized hiatal hernia. Normal gastric mucosa.                           - Normal duodenal bulb and second portion of the                            duodenum.                           - No specimens collected. Moderate Sedation:      Moderate (conscious) sedation was personally administered by an       anesthesia professional. The following parameters were monitored: oxygen       saturation, heart rate, blood pressure, respiratory rate, EKG, adequacy       of pulmonary ventilation, and response to  care. Recommendation:           - Patient has a contact number available for                            emergencies. The signs and symptoms of potential                            delayed complications were discussed with the                            patient. Return to normal activities tomorrow.                            Written discharge instructions were provided to the                            patient.                           - Resume previous diet.                           - Continue present medications. Increase Protonix                            to 40 mg 30 minutes for breakfast and supper daily.                           - Return to my office in 3 months. See colonoscopy                            report. Procedure Code(s):        --- Professional ---                           [phone], Esophagogastroduodenoscopy, flexible,                            transoral; diagnostic, including collection of  specimen(s) by brushing or washing, when performed                            (separate procedure)                           43450, Dilation of esophagus, by unguided sound or                            bougie, single or multiple passes Diagnosis Code(s):        --- Professional ---                           K22.2, Esophageal obstruction                           K44.9, Diaphragmatic hernia without obstruction or                            gangrene                           R13.10, Dysphagia, unspecified CPT copyright 2019 American Medical Association. All rights reserved. The codes documented in this report are preliminary and upon coder review may  be revised to meet current compliance requirements. Cristopher Estimable. Keara Pagliarulo, MD Norvel Richards, MD 10/03/2021 8:09:53 AM This report has been signed electronically. Number of Addenda: 0

## 2021-10-03 NOTE — Transfer of Care (Addendum)
Immediate Anesthesia Transfer of Care Note  Patient: Beverly Young  Procedure(s) Performed: COLONOSCOPY WITH PROPOFOL ESOPHAGOGASTRODUODENOSCOPY (EGD) WITH PROPOFOL MALONEY DILATION  Patient Location: PACU and Short Stay  Anesthesia Type:MAC  Level of Consciousness: awake  Airway & Oxygen Therapy: Patient Spontanous Breathing  Post-op Assessment: Report given to RN  Post vital signs: Reviewed  Last Vitals:  Vitals Value Taken Time  BP    Temp    Pulse    Resp    SpO2      Last Pain:  Vitals:   10/03/21 0732  TempSrc:   PainSc: 0-No pain      Patients Stated Pain Goal: 5 (53/66/44 0347)  Complications: There were no known notable events for this encounter.

## 2021-10-03 NOTE — Telephone Encounter (Signed)
RX was sent to pharmacy on file. 

## 2021-10-03 NOTE — Anesthesia Preprocedure Evaluation (Signed)
Anesthesia Evaluation  Patient identified by MRN, date of birth, ID band Patient awake    Reviewed: Allergy & Precautions, H&P , NPO status , Patient's Chart, lab work & pertinent test results, reviewed documented beta blocker date and time   Airway Mallampati: II  TM Distance: >3 FB Neck ROM: full    Dental no notable dental hx.    Pulmonary neg pulmonary ROS,    Pulmonary exam normal breath sounds clear to auscultation       Cardiovascular Exercise Tolerance: Good hypertension, negative cardio ROS   Rhythm:regular Rate:Normal     Neuro/Psych negative neurological ROS  negative psych ROS   GI/Hepatic Neg liver ROS, GERD  Medicated,  Endo/Other  Morbid obesity  Renal/GU negative Renal ROS  negative genitourinary   Musculoskeletal   Abdominal   Peds  Hematology  (+) Blood dyscrasia, anemia ,   Anesthesia Other Findings   Reproductive/Obstetrics negative OB ROS                             Anesthesia Physical Anesthesia Plan  ASA: 3  Anesthesia Plan: General   Post-op Pain Management:    Induction:   PONV Risk Score and Plan: Propofol infusion  Airway Management Planned:   Additional Equipment:   Intra-op Plan:   Post-operative Plan:   Informed Consent: I have reviewed the patients History and Physical, chart, labs and discussed the procedure including the risks, benefits and alternatives for the proposed anesthesia with the patient or authorized representative who has indicated his/her understanding and acceptance.     Dental Advisory Given  Plan Discussed with: CRNA  Anesthesia Plan Comments:         Anesthesia Quick Evaluation

## 2021-10-04 NOTE — Anesthesia Postprocedure Evaluation (Signed)
Anesthesia Post Note  Patient: Beverly Young  Procedure(s) Performed: COLONOSCOPY WITH PROPOFOL ESOPHAGOGASTRODUODENOSCOPY (EGD) WITH PROPOFOL [place]  Patient location during evaluation: Phase II Anesthesia Type: MAC Level of consciousness: awake Pain management: pain level controlled Vital Signs Assessment: post-procedure vital signs reviewed and stable Respiratory status: spontaneous breathing and respiratory function stable Cardiovascular status: blood pressure returned to baseline and stable Postop Assessment: no headache and no apparent nausea or vomiting Anesthetic complications: no Comments: Late entry   There were no known notable events for this encounter.   Last Vitals:  Vitals:   10/03/21 0816 10/03/21 0827  BP: (!) 127/47 127/83  Pulse: (!) 59   Resp: 17   Temp: (!) 36.1 C   SpO2: 97%     Last Pain:  Vitals:   10/03/21 0827  TempSrc: Oral  PainSc:                  Louann Sjogren

## 2021-10-09 ENCOUNTER — Encounter (HOSPITAL_COMMUNITY): Payer: Self-pay | Admitting: Internal Medicine

## 2021-10-09 DIAGNOSIS — I10 Essential (primary) hypertension: Secondary | ICD-10-CM | POA: Diagnosis not present

## 2021-10-09 DIAGNOSIS — E782 Mixed hyperlipidemia: Secondary | ICD-10-CM | POA: Diagnosis not present

## 2021-10-09 DIAGNOSIS — K219 Gastro-esophageal reflux disease without esophagitis: Secondary | ICD-10-CM | POA: Diagnosis not present

## 2021-10-29 ENCOUNTER — Encounter: Payer: Self-pay | Admitting: *Deleted

## 2021-11-08 DIAGNOSIS — I10 Essential (primary) hypertension: Secondary | ICD-10-CM | POA: Diagnosis not present

## 2021-11-08 DIAGNOSIS — E782 Mixed hyperlipidemia: Secondary | ICD-10-CM | POA: Diagnosis not present

## 2021-11-08 DIAGNOSIS — K219 Gastro-esophageal reflux disease without esophagitis: Secondary | ICD-10-CM | POA: Diagnosis not present

## 2021-12-31 DIAGNOSIS — E782 Mixed hyperlipidemia: Secondary | ICD-10-CM | POA: Diagnosis not present

## 2021-12-31 DIAGNOSIS — R7303 Prediabetes: Secondary | ICD-10-CM | POA: Diagnosis not present

## 2022-01-05 ENCOUNTER — Other Ambulatory Visit (HOSPITAL_COMMUNITY): Payer: Self-pay | Admitting: Internal Medicine

## 2022-01-05 DIAGNOSIS — R600 Localized edema: Secondary | ICD-10-CM | POA: Diagnosis not present

## 2022-01-05 DIAGNOSIS — R252 Cramp and spasm: Secondary | ICD-10-CM | POA: Diagnosis not present

## 2022-01-05 DIAGNOSIS — K219 Gastro-esophageal reflux disease without esophagitis: Secondary | ICD-10-CM | POA: Diagnosis not present

## 2022-01-05 DIAGNOSIS — E782 Mixed hyperlipidemia: Secondary | ICD-10-CM | POA: Diagnosis not present

## 2022-01-05 DIAGNOSIS — Z23 Encounter for immunization: Secondary | ICD-10-CM | POA: Diagnosis not present

## 2022-01-05 DIAGNOSIS — E1122 Type 2 diabetes mellitus with diabetic chronic kidney disease: Secondary | ICD-10-CM | POA: Diagnosis not present

## 2022-01-05 DIAGNOSIS — Z6841 Body Mass Index (BMI) 40.0 and over, adult: Secondary | ICD-10-CM | POA: Diagnosis not present

## 2022-01-05 DIAGNOSIS — N1831 Chronic kidney disease, stage 3a: Secondary | ICD-10-CM | POA: Diagnosis not present

## 2022-01-05 DIAGNOSIS — Z1211 Encounter for screening for malignant neoplasm of colon: Secondary | ICD-10-CM | POA: Diagnosis not present

## 2022-01-05 DIAGNOSIS — I10 Essential (primary) hypertension: Secondary | ICD-10-CM | POA: Diagnosis not present

## 2022-01-05 DIAGNOSIS — R17 Unspecified jaundice: Secondary | ICD-10-CM | POA: Diagnosis not present

## 2022-01-05 DIAGNOSIS — Z1231 Encounter for screening mammogram for malignant neoplasm of breast: Secondary | ICD-10-CM

## 2022-01-07 ENCOUNTER — Other Ambulatory Visit (HOSPITAL_COMMUNITY): Payer: Self-pay | Admitting: Internal Medicine

## 2022-01-07 DIAGNOSIS — R17 Unspecified jaundice: Secondary | ICD-10-CM

## 2022-01-16 ENCOUNTER — Ambulatory Visit (HOSPITAL_COMMUNITY)
Admission: RE | Admit: 2022-01-16 | Discharge: 2022-01-16 | Disposition: A | Discharge status: Home / Self Care | Payer: PPO | Source: Ambulatory Visit | Attending: Internal Medicine | Admitting: Internal Medicine

## 2022-01-16 DIAGNOSIS — R17 Unspecified jaundice: Secondary | ICD-10-CM | POA: Diagnosis not present

## 2022-01-16 DIAGNOSIS — K8020 Calculus of gallbladder without cholecystitis without obstruction: Secondary | ICD-10-CM | POA: Diagnosis not present

## 2022-01-23 DIAGNOSIS — Z0000 Encounter for general adult medical examination without abnormal findings: Secondary | ICD-10-CM | POA: Diagnosis not present

## 2022-01-29 ENCOUNTER — Ambulatory Visit (HOSPITAL_COMMUNITY): Payer: PPO

## 2022-02-04 ENCOUNTER — Encounter: Payer: Self-pay | Admitting: Internal Medicine

## 2022-02-13 ENCOUNTER — Ambulatory Visit (HOSPITAL_COMMUNITY)
Admission: RE | Admit: 2022-02-13 | Discharge: 2022-02-13 | Disposition: A | Discharge status: Home / Self Care | Payer: PPO | Source: Ambulatory Visit | Attending: Internal Medicine | Admitting: Internal Medicine

## 2022-02-13 DIAGNOSIS — Z1231 Encounter for screening mammogram for malignant neoplasm of breast: Secondary | ICD-10-CM | POA: Diagnosis not present

## 2022-04-15 DIAGNOSIS — E782 Mixed hyperlipidemia: Secondary | ICD-10-CM | POA: Diagnosis not present

## 2022-04-15 DIAGNOSIS — E1122 Type 2 diabetes mellitus with diabetic chronic kidney disease: Secondary | ICD-10-CM | POA: Diagnosis not present

## 2022-04-21 DIAGNOSIS — K219 Gastro-esophageal reflux disease without esophagitis: Secondary | ICD-10-CM | POA: Diagnosis not present

## 2022-04-21 DIAGNOSIS — I10 Essential (primary) hypertension: Secondary | ICD-10-CM | POA: Diagnosis not present

## 2022-04-21 DIAGNOSIS — K8020 Calculus of gallbladder without cholecystitis without obstruction: Secondary | ICD-10-CM | POA: Diagnosis not present

## 2022-04-21 DIAGNOSIS — Z0000 Encounter for general adult medical examination without abnormal findings: Secondary | ICD-10-CM | POA: Diagnosis not present

## 2022-04-21 DIAGNOSIS — R252 Cramp and spasm: Secondary | ICD-10-CM | POA: Diagnosis not present

## 2022-04-21 DIAGNOSIS — E782 Mixed hyperlipidemia: Secondary | ICD-10-CM | POA: Diagnosis not present

## 2022-04-21 DIAGNOSIS — Z6841 Body Mass Index (BMI) 40.0 and over, adult: Secondary | ICD-10-CM | POA: Diagnosis not present

## 2022-04-21 DIAGNOSIS — E1122 Type 2 diabetes mellitus with diabetic chronic kidney disease: Secondary | ICD-10-CM | POA: Diagnosis not present

## 2022-04-21 DIAGNOSIS — I129 Hypertensive chronic kidney disease with stage 1 through stage 4 chronic kidney disease, or unspecified chronic kidney disease: Secondary | ICD-10-CM | POA: Diagnosis not present

## 2022-04-21 DIAGNOSIS — N1831 Chronic kidney disease, stage 3a: Secondary | ICD-10-CM | POA: Diagnosis not present

## 2022-04-21 DIAGNOSIS — G8929 Other chronic pain: Secondary | ICD-10-CM | POA: Diagnosis not present

## 2022-06-06 IMAGING — CT CT HEART MORP W/ CTA COR W/ SCORE W/ CA W/CM &/OR W/O CM
1 series · 13 of 18 positions shown, 17 images · non-contrast
Comparison: None.
COMPARISON: None.

Addendum:
EXAM:
OVER-READ INTERPRETATION  CT CHEST

The following report is an over-read performed by radiologist Dr.
Albaro Riehl [[email or web address]] on 06/07/2020. This
over-read does not include interpretation of cardiac or coronary
anatomy or pathology. The coronary calcium score/coronary CTA
interpretation by the cardiologist is attached.
CLINICAL DATA: Chest pain
Cardiac/Coronary CTA
TECHNIQUE: A non-contrast, gated CT scan was obtained with axial slices of 3 mm
through the heart for calcium scoring. Calcium scoring was performed
using the Agatston method. A 130 kV prospective, gated, contrast
cardiac scan was obtained. Gantry rotation speed was 250 msecs and
collimation was 0.6 mm. Two sublingual nitroglycerin tablets (0.8
mg) were given. The 3D data set was reconstructed in 5% intervals of
the 35-75% of the R-R cycle. Diastolic phases were analyzed on a
dedicated workstation using MPR, MIP, and VRT modes. The patient
received 125 cc of contrast.

[Series 488: findings · 13 of 18 slices shown, 17 images]
[im 2/18  vessel]
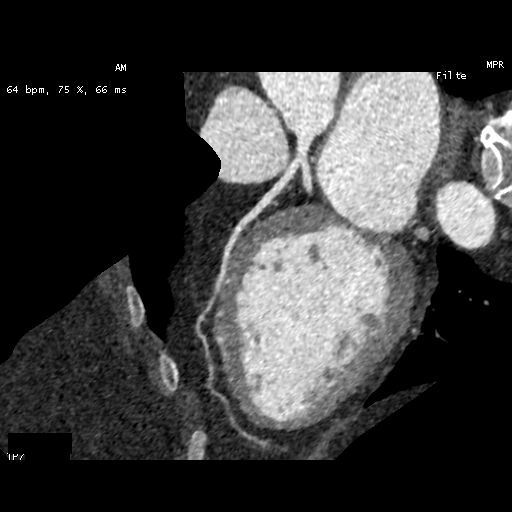
[im 2/18  lung]
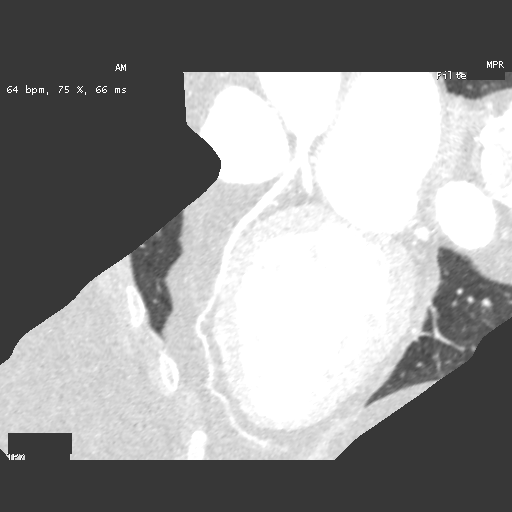
[im 3/18  vessel]
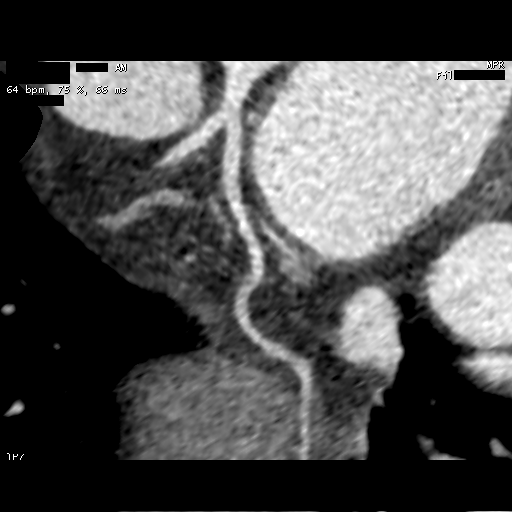
[im 5/18  vessel]
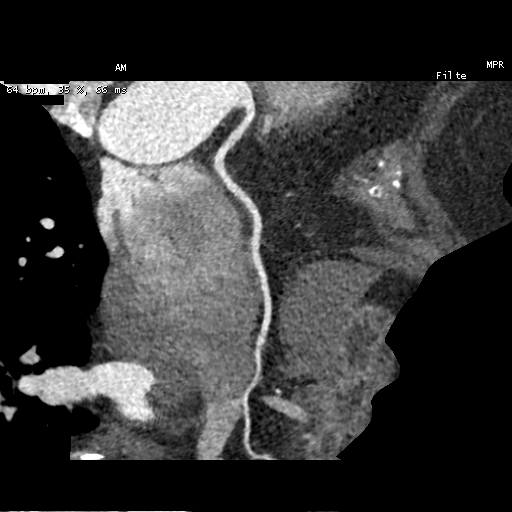
[im 6/18  vessel]
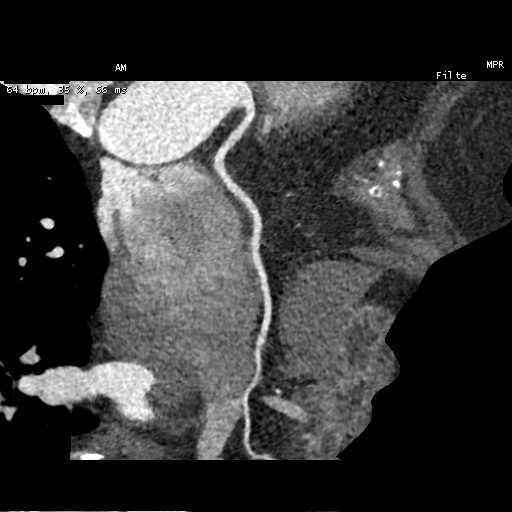
[im 7/18  vessel]
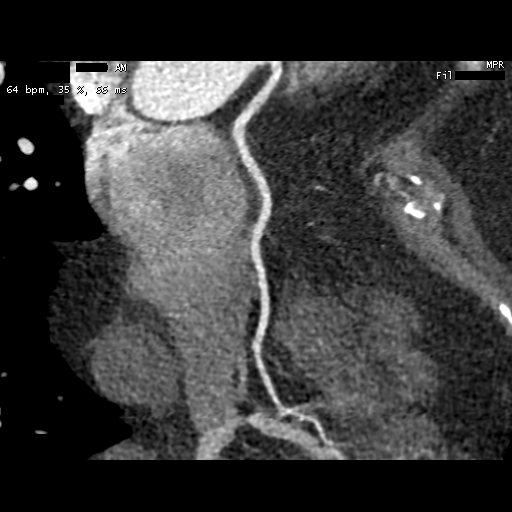
[im 7/18  lung]
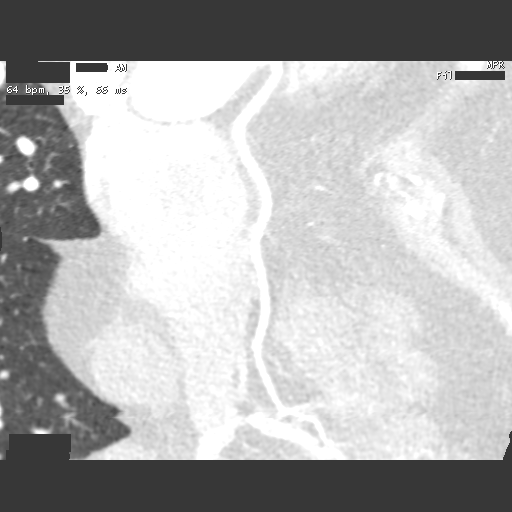
[im 8/18  vessel]
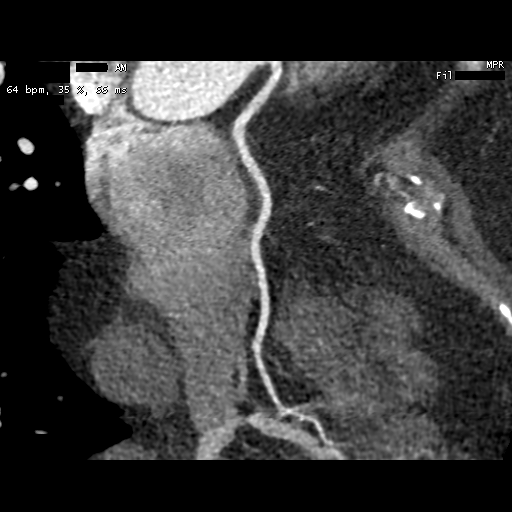
[im 10/18  vessel]
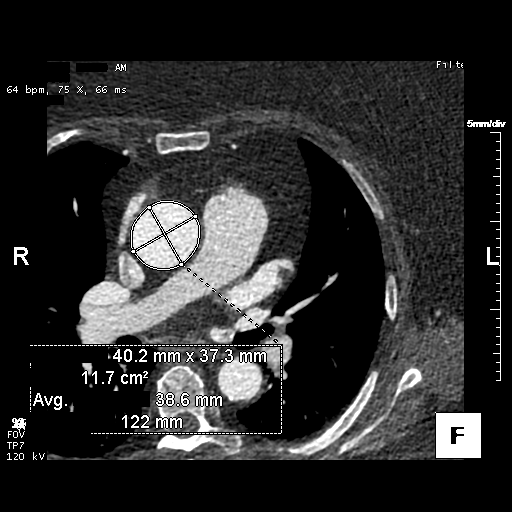
[im 11/18  vessel]
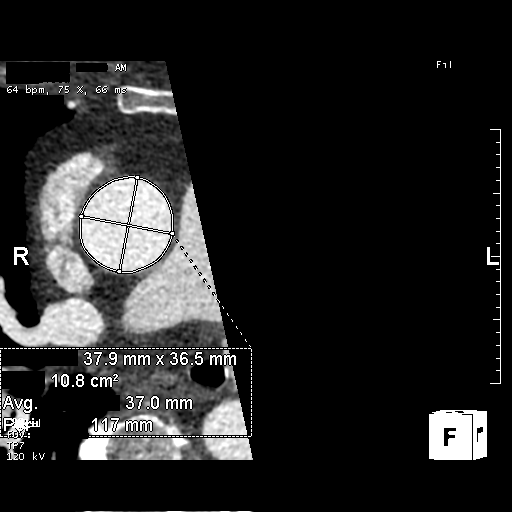
[im 12/18  vessel]
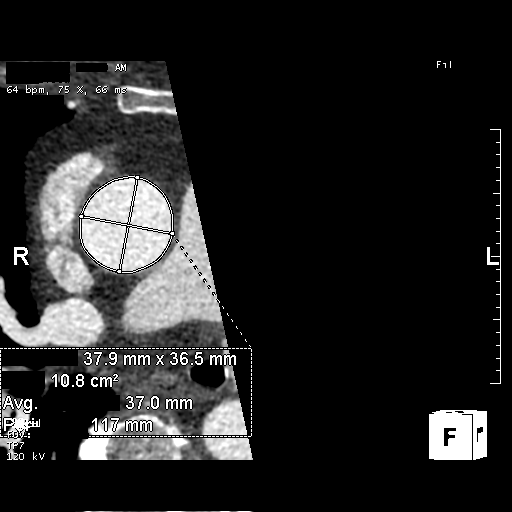
[im 12/18  lung]
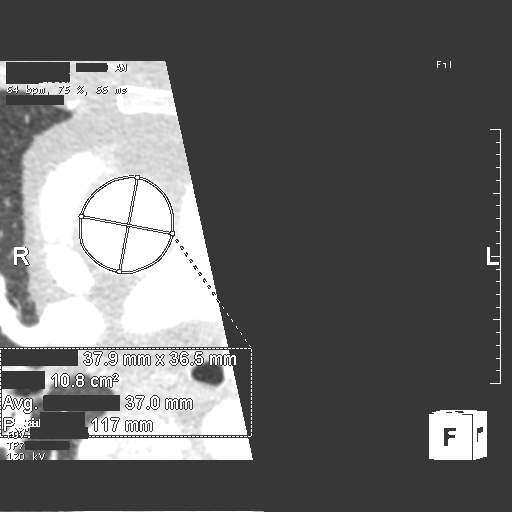
[im 13/18  vessel]
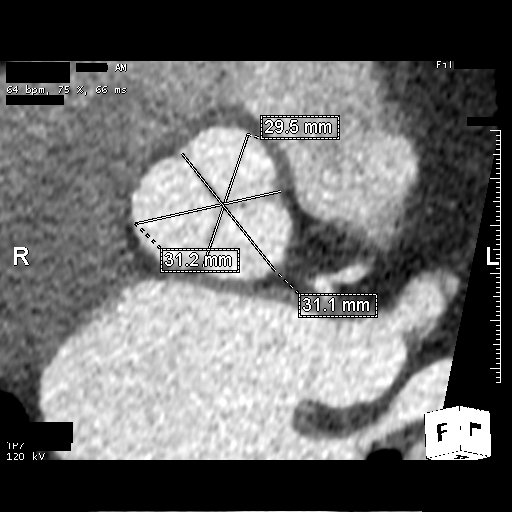
[im 14/18  vessel]
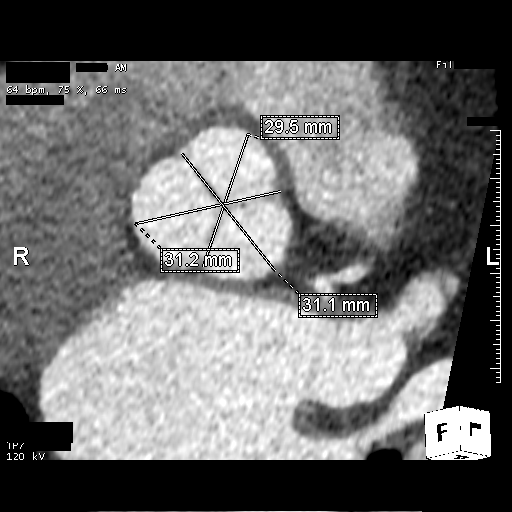
[im 16/18  vessel]
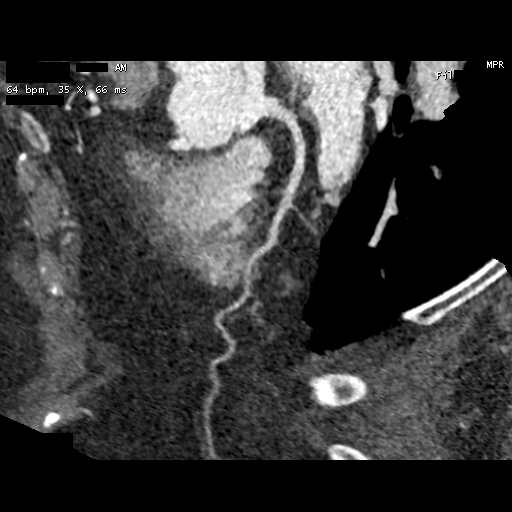
[im 17/18  vessel]
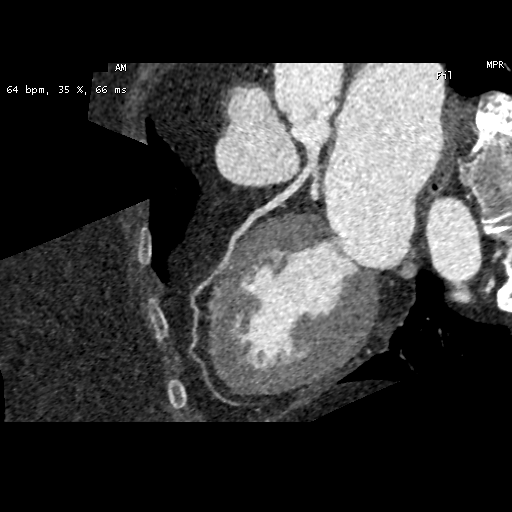
[im 17/18  lung]
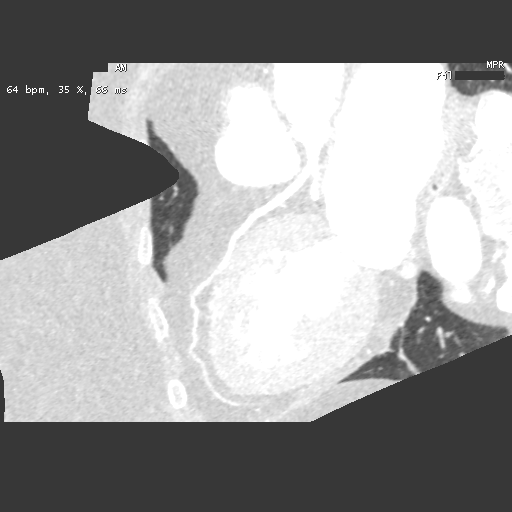

[13 of 18 positions shown; findings below may reference images not displayed]

FINDINGS: Aortic atherosclerosis. Within the visualized portions of the thorax
there are no suspicious appearing pulmonary nodules or masses, there
is no acute consolidative airspace disease, no pleural effusions, no
pneumothorax and no lymphadenopathy. Visualized portions of the
upper abdomen are unremarkable. There are no aggressive appearing
lytic or blastic lesions noted in the visualized portions of the
skeleton.
IMPRESSION: 1.  Aortic Atherosclerosis (JKJH9-SR3.3).
FINDINGS: Image quality: Excellent.

Noise artifact is: Mild obesity artifact.

Coronary Arteries:  Normal coronary origin.  Right dominance.

Left main: The left main is a large caliber vessel with a normal
take off from the left coronary cusp that bifurcates to form a left
anterior descending artery and a left circumflex artery. There is no
plaque or stenosis.

Left anterior descending artery: The LAD is patent without evidence
of plaque or stenosis.

Left circumflex artery: The LCX is non-dominant and patent with no
evidence of plaque or stenosis. The LCX gives off 1 patent obtuse
marginal branch.

Right coronary artery: The RCA is dominant with normal take off from
the right coronary cusp. There is no evidence of plaque or stenosis.
The RCA terminates as a PDA and right posterolateral branch without
evidence of plaque or stenosis.

Right Atrium: Right atrial size is within normal limits.

Right Ventricle: The right ventricular cavity is within normal
limits.

Left Atrium: Left atrial size is normal in size with no left atrial
appendage filling defect.

Left Ventricle: The ventricular cavity size is within normal limits.
There are no stigmata of prior infarction. There is no abnormal
filling defect.

Pulmonary arteries: Normal in size without proximal filling defect.

Pulmonary veins: Normal pulmonary venous drainage.

Pericardium: Normal thickness with no significant effusion or
calcium present.

Cardiac valves: The aortic valve is trileaflet without significant
calcification. The mitral valve is normal structure without
significant calcification.

Aorta: Normal caliber with no significant disease.

Extra-cardiac findings: See attached radiology report for
non-cardiac structures.
IMPRESSION: 1. Coronary calcium score of 0.

2. Normal coronary origin with right dominance.

3. Normal coronary arteries.

RECOMMENDATIONS:
1. No evidence of CAD (0%). Consider non-atherosclerotic causes of
chest pain.

*** End of Addendum ***
EXAM:
OVER-READ INTERPRETATION  CT CHEST

The following report is an over-read performed by radiologist Dr.
Albaro Riehl [[email or web address]] on 06/07/2020. This
over-read does not include interpretation of cardiac or coronary
anatomy or pathology. The coronary calcium score/coronary CTA
interpretation by the cardiologist is attached.
FINDINGS: Aortic atherosclerosis. Within the visualized portions of the thorax
there are no suspicious appearing pulmonary nodules or masses, there
is no acute consolidative airspace disease, no pleural effusions, no
pneumothorax and no lymphadenopathy. Visualized portions of the
upper abdomen are unremarkable. There are no aggressive appearing
lytic or blastic lesions noted in the visualized portions of the
skeleton.
IMPRESSION: 1.  Aortic Atherosclerosis (JKJH9-SR3.3).

## 2022-06-10 DIAGNOSIS — H25813 Combined forms of age-related cataract, bilateral: Secondary | ICD-10-CM | POA: Diagnosis not present

## 2022-06-10 DIAGNOSIS — H5213 Myopia, bilateral: Secondary | ICD-10-CM | POA: Diagnosis not present

## 2022-06-10 DIAGNOSIS — H52203 Unspecified astigmatism, bilateral: Secondary | ICD-10-CM | POA: Diagnosis not present

## 2022-06-10 DIAGNOSIS — H524 Presbyopia: Secondary | ICD-10-CM | POA: Diagnosis not present

## 2022-08-04 DIAGNOSIS — E782 Mixed hyperlipidemia: Secondary | ICD-10-CM | POA: Diagnosis not present

## 2022-08-04 DIAGNOSIS — E1122 Type 2 diabetes mellitus with diabetic chronic kidney disease: Secondary | ICD-10-CM | POA: Diagnosis not present

## 2022-08-10 DIAGNOSIS — R17 Unspecified jaundice: Secondary | ICD-10-CM | POA: Diagnosis not present

## 2022-08-10 DIAGNOSIS — R600 Localized edema: Secondary | ICD-10-CM | POA: Diagnosis not present

## 2022-08-10 DIAGNOSIS — G8929 Other chronic pain: Secondary | ICD-10-CM | POA: Diagnosis not present

## 2022-08-10 DIAGNOSIS — E782 Mixed hyperlipidemia: Secondary | ICD-10-CM | POA: Diagnosis not present

## 2022-08-10 DIAGNOSIS — K219 Gastro-esophageal reflux disease without esophagitis: Secondary | ICD-10-CM | POA: Diagnosis not present

## 2022-08-10 DIAGNOSIS — I10 Essential (primary) hypertension: Secondary | ICD-10-CM | POA: Diagnosis not present

## 2022-08-10 DIAGNOSIS — L859 Epidermal thickening, unspecified: Secondary | ICD-10-CM | POA: Diagnosis not present

## 2022-08-10 DIAGNOSIS — R252 Cramp and spasm: Secondary | ICD-10-CM | POA: Diagnosis not present

## 2022-08-10 DIAGNOSIS — Z6841 Body Mass Index (BMI) 40.0 and over, adult: Secondary | ICD-10-CM | POA: Diagnosis not present

## 2022-08-10 DIAGNOSIS — E1122 Type 2 diabetes mellitus with diabetic chronic kidney disease: Secondary | ICD-10-CM | POA: Diagnosis not present

## 2022-08-10 DIAGNOSIS — N1831 Chronic kidney disease, stage 3a: Secondary | ICD-10-CM | POA: Diagnosis not present

## 2022-09-04 DIAGNOSIS — I129 Hypertensive chronic kidney disease with stage 1 through stage 4 chronic kidney disease, or unspecified chronic kidney disease: Secondary | ICD-10-CM | POA: Diagnosis not present

## 2022-09-04 DIAGNOSIS — N1832 Chronic kidney disease, stage 3b: Secondary | ICD-10-CM | POA: Diagnosis not present

## 2022-09-04 DIAGNOSIS — G4733 Obstructive sleep apnea (adult) (pediatric): Secondary | ICD-10-CM | POA: Diagnosis not present

## 2022-09-04 DIAGNOSIS — E1122 Type 2 diabetes mellitus with diabetic chronic kidney disease: Secondary | ICD-10-CM | POA: Diagnosis not present

## 2022-09-16 ENCOUNTER — Other Ambulatory Visit (HOSPITAL_COMMUNITY): Payer: Self-pay | Admitting: Nephrology

## 2022-09-16 DIAGNOSIS — N1832 Chronic kidney disease, stage 3b: Secondary | ICD-10-CM

## 2022-09-16 DIAGNOSIS — D631 Anemia in chronic kidney disease: Secondary | ICD-10-CM

## 2022-09-16 DIAGNOSIS — R2243 Localized swelling, mass and lump, lower limb, bilateral: Secondary | ICD-10-CM

## 2022-09-21 DIAGNOSIS — I5032 Chronic diastolic (congestive) heart failure: Secondary | ICD-10-CM | POA: Diagnosis not present

## 2022-09-21 DIAGNOSIS — G4733 Obstructive sleep apnea (adult) (pediatric): Secondary | ICD-10-CM | POA: Diagnosis not present

## 2022-09-21 DIAGNOSIS — E1122 Type 2 diabetes mellitus with diabetic chronic kidney disease: Secondary | ICD-10-CM | POA: Diagnosis not present

## 2022-09-21 DIAGNOSIS — N1832 Chronic kidney disease, stage 3b: Secondary | ICD-10-CM | POA: Diagnosis not present

## 2022-09-21 DIAGNOSIS — I129 Hypertensive chronic kidney disease with stage 1 through stage 4 chronic kidney disease, or unspecified chronic kidney disease: Secondary | ICD-10-CM | POA: Diagnosis not present

## 2022-09-21 DIAGNOSIS — N189 Chronic kidney disease, unspecified: Secondary | ICD-10-CM | POA: Diagnosis not present

## 2022-09-29 ENCOUNTER — Ambulatory Visit (HOSPITAL_COMMUNITY)
Admission: RE | Admit: 2022-09-29 | Discharge: 2022-09-29 | Disposition: A | Discharge status: Home / Self Care | Payer: PPO | Source: Ambulatory Visit | Attending: Nephrology | Admitting: Nephrology

## 2022-09-29 DIAGNOSIS — N189 Chronic kidney disease, unspecified: Secondary | ICD-10-CM | POA: Diagnosis not present

## 2022-09-29 DIAGNOSIS — N1832 Chronic kidney disease, stage 3b: Secondary | ICD-10-CM | POA: Insufficient documentation

## 2022-09-29 DIAGNOSIS — N281 Cyst of kidney, acquired: Secondary | ICD-10-CM | POA: Diagnosis not present

## 2022-09-29 DIAGNOSIS — D631 Anemia in chronic kidney disease: Secondary | ICD-10-CM | POA: Diagnosis not present

## 2022-10-22 ENCOUNTER — Ambulatory Visit (HOSPITAL_COMMUNITY)
Admission: RE | Admit: 2022-10-22 | Discharge: 2022-10-22 | Disposition: A | Discharge status: Home / Self Care | Payer: PPO | Source: Ambulatory Visit | Attending: Nephrology | Admitting: Nephrology

## 2022-10-22 DIAGNOSIS — I10 Essential (primary) hypertension: Secondary | ICD-10-CM | POA: Diagnosis not present

## 2022-10-22 DIAGNOSIS — R002 Palpitations: Secondary | ICD-10-CM | POA: Insufficient documentation

## 2022-10-22 DIAGNOSIS — E785 Hyperlipidemia, unspecified: Secondary | ICD-10-CM | POA: Insufficient documentation

## 2022-10-22 DIAGNOSIS — R2243 Localized swelling, mass and lump, lower limb, bilateral: Secondary | ICD-10-CM

## 2022-10-22 LAB — ECHOCARDIOGRAM COMPLETE
AR max vel: 2.23 cm2
AV Area VTI: 2.23 cm2
AV Area mean vel: 2.5 cm2
AV Mean grad: 6 mmHg
AV Peak grad: 11.8 mmHg
Ao pk vel: 1.72 m/s
Area-P 1/2: 4.68 cm2
S' Lateral: 2.9 cm

## 2022-10-22 NOTE — Progress Notes (Signed)
*  PRELIMINARY RESULTS* Echocardiogram 2D Echocardiogram has been performed.  Stacey Drain 10/22/2022, 4:41 PM

## 2022-12-04 DIAGNOSIS — R809 Proteinuria, unspecified: Secondary | ICD-10-CM | POA: Diagnosis not present

## 2022-12-04 DIAGNOSIS — R7303 Prediabetes: Secondary | ICD-10-CM | POA: Diagnosis not present

## 2022-12-04 DIAGNOSIS — N1832 Chronic kidney disease, stage 3b: Secondary | ICD-10-CM | POA: Diagnosis not present

## 2022-12-04 DIAGNOSIS — I5032 Chronic diastolic (congestive) heart failure: Secondary | ICD-10-CM | POA: Diagnosis not present

## 2022-12-04 DIAGNOSIS — G4733 Obstructive sleep apnea (adult) (pediatric): Secondary | ICD-10-CM | POA: Diagnosis not present

## 2022-12-04 DIAGNOSIS — E1122 Type 2 diabetes mellitus with diabetic chronic kidney disease: Secondary | ICD-10-CM | POA: Diagnosis not present

## 2022-12-04 DIAGNOSIS — N189 Chronic kidney disease, unspecified: Secondary | ICD-10-CM | POA: Diagnosis not present

## 2022-12-07 DIAGNOSIS — E782 Mixed hyperlipidemia: Secondary | ICD-10-CM | POA: Diagnosis not present

## 2022-12-07 DIAGNOSIS — E1122 Type 2 diabetes mellitus with diabetic chronic kidney disease: Secondary | ICD-10-CM | POA: Diagnosis not present

## 2022-12-14 DIAGNOSIS — Z6841 Body Mass Index (BMI) 40.0 and over, adult: Secondary | ICD-10-CM | POA: Diagnosis not present

## 2022-12-14 DIAGNOSIS — Z23 Encounter for immunization: Secondary | ICD-10-CM | POA: Diagnosis not present

## 2022-12-14 DIAGNOSIS — R17 Unspecified jaundice: Secondary | ICD-10-CM | POA: Diagnosis not present

## 2022-12-14 DIAGNOSIS — E1122 Type 2 diabetes mellitus with diabetic chronic kidney disease: Secondary | ICD-10-CM | POA: Diagnosis not present

## 2022-12-14 DIAGNOSIS — I10 Essential (primary) hypertension: Secondary | ICD-10-CM | POA: Diagnosis not present

## 2022-12-14 DIAGNOSIS — I129 Hypertensive chronic kidney disease with stage 1 through stage 4 chronic kidney disease, or unspecified chronic kidney disease: Secondary | ICD-10-CM | POA: Diagnosis not present

## 2022-12-14 DIAGNOSIS — E782 Mixed hyperlipidemia: Secondary | ICD-10-CM | POA: Diagnosis not present

## 2022-12-14 DIAGNOSIS — R600 Localized edema: Secondary | ICD-10-CM | POA: Diagnosis not present

## 2022-12-14 DIAGNOSIS — K219 Gastro-esophageal reflux disease without esophagitis: Secondary | ICD-10-CM | POA: Diagnosis not present

## 2022-12-14 DIAGNOSIS — N1831 Chronic kidney disease, stage 3a: Secondary | ICD-10-CM | POA: Diagnosis not present

## 2022-12-14 DIAGNOSIS — R252 Cramp and spasm: Secondary | ICD-10-CM | POA: Diagnosis not present

## 2023-03-12 DIAGNOSIS — D631 Anemia in chronic kidney disease: Secondary | ICD-10-CM | POA: Diagnosis not present

## 2023-03-12 DIAGNOSIS — R809 Proteinuria, unspecified: Secondary | ICD-10-CM | POA: Diagnosis not present

## 2023-03-12 DIAGNOSIS — N189 Chronic kidney disease, unspecified: Secondary | ICD-10-CM | POA: Diagnosis not present

## 2023-03-12 DIAGNOSIS — E211 Secondary hyperparathyroidism, not elsewhere classified: Secondary | ICD-10-CM | POA: Diagnosis not present

## 2023-03-19 DIAGNOSIS — G4733 Obstructive sleep apnea (adult) (pediatric): Secondary | ICD-10-CM | POA: Diagnosis not present

## 2023-03-19 DIAGNOSIS — N1832 Chronic kidney disease, stage 3b: Secondary | ICD-10-CM | POA: Diagnosis not present

## 2023-03-19 DIAGNOSIS — I129 Hypertensive chronic kidney disease with stage 1 through stage 4 chronic kidney disease, or unspecified chronic kidney disease: Secondary | ICD-10-CM | POA: Diagnosis not present

## 2023-03-19 DIAGNOSIS — E1122 Type 2 diabetes mellitus with diabetic chronic kidney disease: Secondary | ICD-10-CM | POA: Diagnosis not present

## 2023-03-25 DIAGNOSIS — E1122 Type 2 diabetes mellitus with diabetic chronic kidney disease: Secondary | ICD-10-CM | POA: Diagnosis not present

## 2023-03-25 DIAGNOSIS — E782 Mixed hyperlipidemia: Secondary | ICD-10-CM | POA: Diagnosis not present

## 2023-03-31 DIAGNOSIS — N1831 Chronic kidney disease, stage 3a: Secondary | ICD-10-CM | POA: Diagnosis not present

## 2023-03-31 DIAGNOSIS — K8020 Calculus of gallbladder without cholecystitis without obstruction: Secondary | ICD-10-CM | POA: Diagnosis not present

## 2023-03-31 DIAGNOSIS — I129 Hypertensive chronic kidney disease with stage 1 through stage 4 chronic kidney disease, or unspecified chronic kidney disease: Secondary | ICD-10-CM | POA: Diagnosis not present

## 2023-03-31 DIAGNOSIS — I10 Essential (primary) hypertension: Secondary | ICD-10-CM | POA: Diagnosis not present

## 2023-03-31 DIAGNOSIS — R252 Cramp and spasm: Secondary | ICD-10-CM | POA: Diagnosis not present

## 2023-03-31 DIAGNOSIS — E1122 Type 2 diabetes mellitus with diabetic chronic kidney disease: Secondary | ICD-10-CM | POA: Diagnosis not present

## 2023-03-31 DIAGNOSIS — Z6841 Body Mass Index (BMI) 40.0 and over, adult: Secondary | ICD-10-CM | POA: Diagnosis not present

## 2023-03-31 DIAGNOSIS — R7989 Other specified abnormal findings of blood chemistry: Secondary | ICD-10-CM | POA: Diagnosis not present

## 2023-03-31 DIAGNOSIS — K219 Gastro-esophageal reflux disease without esophagitis: Secondary | ICD-10-CM | POA: Diagnosis not present

## 2023-03-31 DIAGNOSIS — R600 Localized edema: Secondary | ICD-10-CM | POA: Diagnosis not present

## 2023-03-31 DIAGNOSIS — E782 Mixed hyperlipidemia: Secondary | ICD-10-CM | POA: Diagnosis not present

## 2023-07-16 DIAGNOSIS — N189 Chronic kidney disease, unspecified: Secondary | ICD-10-CM | POA: Diagnosis not present

## 2023-07-16 DIAGNOSIS — D631 Anemia in chronic kidney disease: Secondary | ICD-10-CM | POA: Diagnosis not present

## 2023-07-16 DIAGNOSIS — R809 Proteinuria, unspecified: Secondary | ICD-10-CM | POA: Diagnosis not present

## 2023-07-22 DIAGNOSIS — E1122 Type 2 diabetes mellitus with diabetic chronic kidney disease: Secondary | ICD-10-CM | POA: Diagnosis not present

## 2023-07-22 DIAGNOSIS — E782 Mixed hyperlipidemia: Secondary | ICD-10-CM | POA: Diagnosis not present

## 2023-07-23 DIAGNOSIS — N1832 Chronic kidney disease, stage 3b: Secondary | ICD-10-CM | POA: Diagnosis not present

## 2023-07-23 DIAGNOSIS — E1122 Type 2 diabetes mellitus with diabetic chronic kidney disease: Secondary | ICD-10-CM | POA: Diagnosis not present

## 2023-07-23 DIAGNOSIS — G4733 Obstructive sleep apnea (adult) (pediatric): Secondary | ICD-10-CM | POA: Diagnosis not present

## 2023-07-23 DIAGNOSIS — I129 Hypertensive chronic kidney disease with stage 1 through stage 4 chronic kidney disease, or unspecified chronic kidney disease: Secondary | ICD-10-CM | POA: Diagnosis not present

## 2023-07-29 DIAGNOSIS — E782 Mixed hyperlipidemia: Secondary | ICD-10-CM | POA: Diagnosis not present

## 2023-07-29 DIAGNOSIS — K8020 Calculus of gallbladder without cholecystitis without obstruction: Secondary | ICD-10-CM | POA: Diagnosis not present

## 2023-07-29 DIAGNOSIS — I10 Essential (primary) hypertension: Secondary | ICD-10-CM | POA: Diagnosis not present

## 2023-07-29 DIAGNOSIS — N1831 Chronic kidney disease, stage 3a: Secondary | ICD-10-CM | POA: Diagnosis not present

## 2023-07-29 DIAGNOSIS — Z6841 Body Mass Index (BMI) 40.0 and over, adult: Secondary | ICD-10-CM | POA: Diagnosis not present

## 2023-07-29 DIAGNOSIS — E1122 Type 2 diabetes mellitus with diabetic chronic kidney disease: Secondary | ICD-10-CM | POA: Diagnosis not present

## 2023-07-29 DIAGNOSIS — R600 Localized edema: Secondary | ICD-10-CM | POA: Diagnosis not present

## 2023-07-29 DIAGNOSIS — R17 Unspecified jaundice: Secondary | ICD-10-CM | POA: Diagnosis not present

## 2023-07-29 DIAGNOSIS — R252 Cramp and spasm: Secondary | ICD-10-CM | POA: Diagnosis not present

## 2023-07-29 DIAGNOSIS — K219 Gastro-esophageal reflux disease without esophagitis: Secondary | ICD-10-CM | POA: Diagnosis not present

## 2023-07-30 DIAGNOSIS — E119 Type 2 diabetes mellitus without complications: Secondary | ICD-10-CM | POA: Diagnosis not present

## 2023-07-30 DIAGNOSIS — H5213 Myopia, bilateral: Secondary | ICD-10-CM | POA: Diagnosis not present

## 2023-07-30 DIAGNOSIS — H2513 Age-related nuclear cataract, bilateral: Secondary | ICD-10-CM | POA: Diagnosis not present

## 2023-11-19 DIAGNOSIS — R809 Proteinuria, unspecified: Secondary | ICD-10-CM | POA: Diagnosis not present

## 2023-11-19 DIAGNOSIS — D631 Anemia in chronic kidney disease: Secondary | ICD-10-CM | POA: Diagnosis not present

## 2023-11-19 DIAGNOSIS — I10 Essential (primary) hypertension: Secondary | ICD-10-CM | POA: Diagnosis not present

## 2023-11-19 DIAGNOSIS — N189 Chronic kidney disease, unspecified: Secondary | ICD-10-CM | POA: Diagnosis not present

## 2023-11-19 DIAGNOSIS — E119 Type 2 diabetes mellitus without complications: Secondary | ICD-10-CM | POA: Diagnosis not present

## 2023-11-19 DIAGNOSIS — E211 Secondary hyperparathyroidism, not elsewhere classified: Secondary | ICD-10-CM | POA: Diagnosis not present

## 2023-11-19 DIAGNOSIS — E559 Vitamin D deficiency, unspecified: Secondary | ICD-10-CM | POA: Diagnosis not present

## 2023-11-26 DIAGNOSIS — E782 Mixed hyperlipidemia: Secondary | ICD-10-CM | POA: Diagnosis not present

## 2023-11-26 DIAGNOSIS — E1122 Type 2 diabetes mellitus with diabetic chronic kidney disease: Secondary | ICD-10-CM | POA: Diagnosis not present

## 2023-11-26 DIAGNOSIS — I129 Hypertensive chronic kidney disease with stage 1 through stage 4 chronic kidney disease, or unspecified chronic kidney disease: Secondary | ICD-10-CM | POA: Diagnosis not present

## 2023-11-26 DIAGNOSIS — N1832 Chronic kidney disease, stage 3b: Secondary | ICD-10-CM | POA: Diagnosis not present

## 2023-11-26 DIAGNOSIS — G4733 Obstructive sleep apnea (adult) (pediatric): Secondary | ICD-10-CM | POA: Diagnosis not present

## 2023-12-02 DIAGNOSIS — Z0001 Encounter for general adult medical examination with abnormal findings: Secondary | ICD-10-CM | POA: Diagnosis not present

## 2023-12-02 DIAGNOSIS — Z23 Encounter for immunization: Secondary | ICD-10-CM | POA: Diagnosis not present

## 2023-12-02 DIAGNOSIS — R17 Unspecified jaundice: Secondary | ICD-10-CM | POA: Diagnosis not present

## 2023-12-02 DIAGNOSIS — Z0000 Encounter for general adult medical examination without abnormal findings: Secondary | ICD-10-CM | POA: Diagnosis not present

## 2023-12-02 DIAGNOSIS — E1122 Type 2 diabetes mellitus with diabetic chronic kidney disease: Secondary | ICD-10-CM | POA: Diagnosis not present

## 2023-12-02 DIAGNOSIS — E782 Mixed hyperlipidemia: Secondary | ICD-10-CM | POA: Diagnosis not present

## 2023-12-02 DIAGNOSIS — R252 Cramp and spasm: Secondary | ICD-10-CM | POA: Diagnosis not present

## 2023-12-02 DIAGNOSIS — K8020 Calculus of gallbladder without cholecystitis without obstruction: Secondary | ICD-10-CM | POA: Diagnosis not present

## 2023-12-02 DIAGNOSIS — I10 Essential (primary) hypertension: Secondary | ICD-10-CM | POA: Diagnosis not present

## 2023-12-02 DIAGNOSIS — R600 Localized edema: Secondary | ICD-10-CM | POA: Diagnosis not present

## 2023-12-02 DIAGNOSIS — K219 Gastro-esophageal reflux disease without esophagitis: Secondary | ICD-10-CM | POA: Diagnosis not present

## 2024-01-14 ENCOUNTER — Encounter (HOSPITAL_COMMUNITY): Payer: Self-pay

## 2024-01-14 ENCOUNTER — Ambulatory Visit (HOSPITAL_COMMUNITY)

## 2024-01-14 DIAGNOSIS — Z9181 History of falling: Secondary | ICD-10-CM | POA: Insufficient documentation

## 2024-01-14 DIAGNOSIS — M25562 Pain in left knee: Secondary | ICD-10-CM | POA: Insufficient documentation

## 2024-01-14 DIAGNOSIS — G8929 Other chronic pain: Secondary | ICD-10-CM | POA: Insufficient documentation

## 2024-01-14 DIAGNOSIS — M25662 Stiffness of left knee, not elsewhere classified: Secondary | ICD-10-CM | POA: Insufficient documentation
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
## 2024-01-28 ENCOUNTER — Ambulatory Visit (HOSPITAL_COMMUNITY)

## 2024-02-16 ENCOUNTER — Ambulatory Visit (HOSPITAL_COMMUNITY): Admitting: Physical Therapy

## 2024-02-18 ENCOUNTER — Ambulatory Visit (HOSPITAL_COMMUNITY)

## 2024-02-23 ENCOUNTER — Ambulatory Visit (HOSPITAL_COMMUNITY)

## 2024-02-25 ENCOUNTER — Ambulatory Visit (HOSPITAL_COMMUNITY): Attending: Internal Medicine

## 2024-02-25 ENCOUNTER — Telehealth (HOSPITAL_COMMUNITY): Payer: Self-pay

## 2024-02-25 NOTE — Telephone Encounter (Signed)
 Pt was called regarding her no-show of today's appointment. PT left a VM regarding her missed appointment and she was asked to call if she was unable to make her next appointment.   Lacinda Fass, PT, DPT

## 2024-03-01 ENCOUNTER — Ambulatory Visit (HOSPITAL_COMMUNITY): Admitting: Physical Therapy

## 2024-03-03 ENCOUNTER — Ambulatory Visit (HOSPITAL_COMMUNITY)

## 2024-03-08 ENCOUNTER — Ambulatory Visit (HOSPITAL_COMMUNITY)

## 2024-03-10 ENCOUNTER — Ambulatory Visit (HOSPITAL_COMMUNITY)

## 2024-03-15 ENCOUNTER — Ambulatory Visit (HOSPITAL_COMMUNITY)

## 2024-03-15 ENCOUNTER — Encounter (HOSPITAL_COMMUNITY): Payer: Self-pay

## 2024-03-15 ENCOUNTER — Telehealth (HOSPITAL_COMMUNITY): Payer: Self-pay

## 2024-03-15 NOTE — Telephone Encounter (Signed)
 No show.  Left voicemail on patient's phone regarding no show and subsequent discharge due to no show policy.instructed patient she will need a new referral if she needs further therapy services.    2:06 PM, 03/15/24 Tahni Porchia Small Panzy Bubeck MPT [facility] [place] [phone]

## 2024-03-15 NOTE — Therapy (Signed)
 [facility] Outpatient Rehabilitation at [facility] [address] [place], [place], [address] Phone: [phone]   Fax:  [phone]  Patient Details  Name: Beverly Young MRN: [id number] Date of Birth: 07-Sep-1950 Referring Provider:  No ref. provider found  PHYSICAL THERAPY DISCHARGE SUMMARY  Visits from Start of Care: 1  Current functional level related to goals / functional outcomes: unknown   Remaining deficits: unknown   Education / Equipment: HEP Patient agrees to discharge. Patient goals were not met. Patient is being discharged due to not returning since the last visit.  2:09 PM, 03/15/24 Beverly Young Beverly Young MPT [facility] [place] [phone]    [facility] Outpatient Rehabilitation at [facility] [address] [place], [place], [address] Phone: [phone]   Fax:  [phone]

## 2024-03-17 ENCOUNTER — Ambulatory Visit (HOSPITAL_COMMUNITY)
